# Patient Record
Sex: Male | Born: 1955 | Race: Black or African American | Hispanic: No | Marital: Married | Smoking: Never smoker
Health system: Southern US, Community
[De-identification: ages and names within clinical notes are randomized; demographics above are authoritative.]

## PROBLEM LIST (undated history)

## (undated) DIAGNOSIS — M1990 Unspecified osteoarthritis, unspecified site: Secondary | ICD-10-CM

## (undated) DIAGNOSIS — D509 Iron deficiency anemia, unspecified: Secondary | ICD-10-CM

## (undated) DIAGNOSIS — M4800 Spinal stenosis, site unspecified: Secondary | ICD-10-CM

## (undated) DIAGNOSIS — R1310 Dysphagia, unspecified: Secondary | ICD-10-CM

## (undated) DIAGNOSIS — G709 Myoneural disorder, unspecified: Secondary | ICD-10-CM

## (undated) DIAGNOSIS — K219 Gastro-esophageal reflux disease without esophagitis: Secondary | ICD-10-CM

## (undated) DIAGNOSIS — I452 Bifascicular block: Secondary | ICD-10-CM

## (undated) DIAGNOSIS — I10 Essential (primary) hypertension: Secondary | ICD-10-CM

## (undated) DIAGNOSIS — M5090 Cervical disc disorder, unspecified, unspecified cervical region: Secondary | ICD-10-CM

## (undated) HISTORY — DX: Iron deficiency anemia, unspecified: D50.9

## (undated) HISTORY — DX: Gastro-esophageal reflux disease without esophagitis: K21.9

## (undated) HISTORY — PX: REPLACEMENT TOTAL KNEE: SUR1224

## (undated) HISTORY — PX: LUMBAR SPINE SURGERY: SHX701

## (undated) HISTORY — DX: Spinal stenosis, site unspecified: M48.00

## (undated) HISTORY — PX: COLONOSCOPY: SHX174

## (undated) HISTORY — DX: Essential (primary) hypertension: I10

## (undated) HISTORY — PX: JOINT REPLACEMENT: SHX530

## (undated) HISTORY — PX: OTHER SURGICAL HISTORY: SHX169

## (undated) HISTORY — PX: CERVICAL SPINE SURGERY: SHX589

## (undated) HISTORY — DX: Dysphagia, unspecified: R13.10

## (undated) HISTORY — DX: Cervical disc disorder, unspecified, unspecified cervical region: M50.90

---
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
## 1982-08-19 HISTORY — PX: VASECTOMY: SHX75

## 2002-08-19 HISTORY — PX: EYE SURGERY: SHX253

## 2019-12-08 DIAGNOSIS — M5412 Radiculopathy, cervical region: Secondary | ICD-10-CM | POA: Insufficient documentation

## 2019-12-08 DIAGNOSIS — K219 Gastro-esophageal reflux disease without esophagitis: Secondary | ICD-10-CM | POA: Insufficient documentation

## 2019-12-08 DIAGNOSIS — I10 Essential (primary) hypertension: Secondary | ICD-10-CM | POA: Insufficient documentation

## 2019-12-27 ENCOUNTER — Encounter: Payer: Self-pay | Admitting: Gastroenterology

## 2020-01-14 ENCOUNTER — Encounter: Payer: Self-pay | Admitting: *Deleted

## 2020-01-14 ENCOUNTER — Encounter: Payer: Self-pay | Admitting: Gastroenterology

## 2020-01-14 ENCOUNTER — Ambulatory Visit (INDEPENDENT_AMBULATORY_CARE_PROVIDER_SITE_OTHER): Payer: No Typology Code available for payment source | Admitting: Gastroenterology

## 2020-01-14 VITALS — BP 126/82 | HR 64 | Ht 69.0 in | Wt 204.0 lb

## 2020-01-14 DIAGNOSIS — D509 Iron deficiency anemia, unspecified: Secondary | ICD-10-CM | POA: Insufficient documentation

## 2020-01-14 DIAGNOSIS — Z1211 Encounter for screening for malignant neoplasm of colon: Secondary | ICD-10-CM | POA: Diagnosis not present

## 2020-01-14 DIAGNOSIS — R1310 Dysphagia, unspecified: Secondary | ICD-10-CM | POA: Insufficient documentation

## 2020-01-14 DIAGNOSIS — Z01810 Encounter for preprocedural cardiovascular examination: Secondary | ICD-10-CM | POA: Insufficient documentation

## 2020-01-14 MED ORDER — PEG-KCL-NACL-NASULF-NA ASC-C 100 G PO SOLR: 1.0000 | Freq: Once | ORAL | 0 refills | Status: AC

## 2020-01-14 NOTE — Progress Notes (Signed)
`   01/14/2020 Jonathon Zimmerman [id number] 1956/03/06   HISTORY OF PRESENT ILLNESS: This is a 64 year old male who is new to our office.  [facility].  He tells me his last colonoscopy was at least 10 years ago in [place].  It was normal from what he can recall.  One of the diagnoses for referral was iron deficiency anemia.  Recent hemoglobin was 13.8 g with a normal MCV at 86.  Ferritin was slightly low at 23.3.  Other iron studies were not checked.  Vitamin B12 is normal at 554 and folate was normal at 8.1.  Patient denies any dark or bloody stools.  He admits to some what sounds like mild constipation.  Sounds like he uses a fiber supplement but only on an as-needed basis at this point.  Says that he usually has a bowel movement every couple of days or so.  They also referred him for dysphagia.  He says at this point has pretty much resolved.  He had an upper GI study that showed a small hiatal hernia and laryngeal penetration with minimal aspiration.  He has never had an endoscopy in the past.   Past Medical History:  Diagnosis Date  . Cervical disc disorder   . Dysphagia   . GERD (gastroesophageal reflux disease)   . Hypertension   . IDA (iron deficiency anemia)   . Spinal stenosis    Past Surgical History:  Procedure Laterality Date  . neck surgery    . REPLACEMENT TOTAL KNEE Right     reports that he has never smoked. He has quit using smokeless tobacco.  His smokeless tobacco use included chew. He reports that he does not drink alcohol or use drugs. family history includes Hypertension in his mother. No Known Allergies    Outpatient Encounter Medications as of 01/14/2020  Medication Sig  . gabapentin (NEURONTIN) 800 MG tablet Take 800 mg by mouth in the morning, at noon, and at bedtime.  . hydrochlorothiazide (HYDRODIURIL) 25 MG tablet Take 25 mg by mouth daily.  Marland Kitchen lisinopril (ZESTRIL) 20 MG tablet Take 20 mg  by mouth daily.  . pantoprazole (PROTONIX) 40 MG tablet Take 40 mg by mouth daily.  . polyethylene glycol (MIRALAX / GLYCOLAX) 17 g packet Take 17 g by mouth daily.  . sildenafil (VIAGRA) 100 MG tablet Take 100 mg by mouth daily as needed for erectile dysfunction.   No facility-administered encounter medications on file as of 01/14/2020.     REVIEW OF SYSTEMS  : All other systems reviewed and negative except where noted in the History of Present Illness.   PHYSICAL EXAM: BP 126/82   Pulse 64   Ht 5\' 9"  (1.753 m)   Wt 204 lb (92.5 kg)   SpO2 99%   BMI 30.13 kg/m  General: Well developed AA male in no acute distress Head: Normocephalic and atraumatic Eyes:  Sclerae anicteric, conjunctiva pink. Ears: Normal auditory acuity Lungs: Clear throughout to auscultation; no W/R/R. Heart: Regular rate and rhythm; no M/R/G. Abdomen: Soft, non-distended.  BS present.  Non-tender. Rectal:  Will be done at the time of colonoscopy. Musculoskeletal: Symmetrical with no gross deformities  Skin: No lesions on visible extremities Extremities: No edema  Neurological: Alert oriented x 4, grossly non-focal Psychological:  Alert and cooperative. Normal mood and affect  ASSESSMENT AND PLAN: *Mild iron deficiency:  Ferritin just mildly low.  Hgb normal.  Last colonoscopy 10 years ago.  No dark or bloody stools. *Dysphagia: Reports that this is pretty much resolved.  Barium esophagram showed small hiatal hernia and laryngeal penetration with minimal aspiration.  Has never had an EGD in the past. *Mild constipation:  Uses fiber supplement on ly prn at this point.  Discussed trying it daily. *CRC screening  **We will plan for both EGD and colonoscopy with Dr. Fuller Plan.  Using movi prep at the request of the [place] so they can supply him with a sample/prescription.  2-day bowel prep as well at their recommendation for some mild constipation issues.  The risks, benefits, and alternatives to EGD and colonoscopy were  discussed with the patient and he consents to proceed.   CC:  No ref. provider found

## 2020-01-14 NOTE — Patient Instructions (Signed)
If you are age 63 or older, your body mass index should be between 23-30. Your Body mass index is 30.13 kg/m. If this is out of the aforementioned range listed, please consider follow up with your Primary Care Provider.  If you are age 34 or younger, your body mass index should be between 19-25. Your Body mass index is 30.13 kg/m. If this is out of the aformentioned range listed, please consider follow up with your Primary Care Provider.   You have been scheduled for an endoscopy and colonoscopy. Please follow the written instructions given to you at your visit today. Please pick up your prep supplies at the pharmacy within the next 1-3 days. If you use inhalers (even only as needed), please bring them with you on the day of your procedure.

## 2020-01-14 NOTE — Progress Notes (Signed)
Reviewed and agree with management plan.  Deitrich Steve T. Alek Poncedeleon, MD FACG [facility]  

## 2020-02-24 ENCOUNTER — Other Ambulatory Visit (HOSPITAL_COMMUNITY): Payer: Self-pay

## 2020-02-24 DIAGNOSIS — R1310 Dysphagia, unspecified: Secondary | ICD-10-CM

## 2020-03-03 ENCOUNTER — Encounter (HOSPITAL_COMMUNITY): Payer: Non-veteran care

## 2020-03-03 ENCOUNTER — Ambulatory Visit (HOSPITAL_COMMUNITY): Payer: Non-veteran care

## 2020-03-08 ENCOUNTER — Ambulatory Visit (AMBULATORY_SURGERY_CENTER): Payer: No Typology Code available for payment source | Admitting: Gastroenterology

## 2020-03-08 ENCOUNTER — Encounter: Payer: Self-pay | Admitting: Gastroenterology

## 2020-03-08 ENCOUNTER — Other Ambulatory Visit: Payer: Self-pay

## 2020-03-08 VITALS — BP 128/85 | HR 68 | Temp 96.6°F | Resp 14 | Ht 69.0 in | Wt 204.0 lb

## 2020-03-08 DIAGNOSIS — K449 Diaphragmatic hernia without obstruction or gangrene: Secondary | ICD-10-CM | POA: Diagnosis not present

## 2020-03-08 DIAGNOSIS — K2100 Gastro-esophageal reflux disease with esophagitis, without bleeding: Secondary | ICD-10-CM

## 2020-03-08 DIAGNOSIS — R1310 Dysphagia, unspecified: Secondary | ICD-10-CM | POA: Diagnosis not present

## 2020-03-08 DIAGNOSIS — K222 Esophageal obstruction: Secondary | ICD-10-CM

## 2020-03-08 DIAGNOSIS — Z1211 Encounter for screening for malignant neoplasm of colon: Secondary | ICD-10-CM | POA: Diagnosis not present

## 2020-03-08 MED ORDER — SODIUM CHLORIDE 0.9 % IV SOLN: 500.0000 mL | Freq: Once | INTRAVENOUS | Status: DC

## 2020-03-08 NOTE — Op Note (Signed)
[facility] Patient Name: Jonathon Zimmerman Procedure Date: 03/08/2020 1:27 PM MRN: [id number] Endoscopist: Meryl Dare , MD Age: 64 Referring MD:  Date of Birth: May 15, 1956 Gender: Male Account #: [id number] Procedure:                Colonoscopy Indications:              Screening for colorectal malignant neoplasm Medicines:                Monitored Anesthesia Care Procedure:                Pre-Anesthesia Assessment:                           - Prior to the procedure, a History and Physical                            was performed, and patient medications and                            allergies were reviewed. The patient's tolerance of                            previous anesthesia was also reviewed. The risks                            and benefits of the procedure and the sedation                            options and risks were discussed with the patient.                            All questions were answered, and informed consent                            was obtained. Prior Anticoagulants: The patient has                            taken no previous anticoagulant or antiplatelet                            agents. ASA Grade Assessment: II - A patient with                            mild systemic disease. After reviewing the risks                            and benefits, the patient was deemed in                            satisfactory condition to undergo the procedure.                           After obtaining informed consent, the colonoscope  was passed under direct vision. Throughout the                            procedure, the patient's blood pressure, pulse, and                            oxygen saturations were monitored continuously. The                            Colonoscope was introduced through the anus and                            advanced to the the cecum, identified by                            appendiceal orifice and  ileocecal valve. The                            ileocecal valve, appendiceal orifice, and rectum                            were photographed. The quality of the bowel                            preparation was excellent. The colonoscopy was                            performed without difficulty. The patient tolerated                            the procedure well. Scope In: 1:36:14 PM Scope Out: 1:51:07 PM Scope Withdrawal Time: 0 hours 11 minutes 25 seconds  Total Procedure Duration: 0 hours 14 minutes 53 seconds  Findings:                 The perianal and digital rectal examinations were                            normal.                           A few small-mouthed diverticula were found in the                            sigmoid colon.                           Internal hemorrhoids were found during                            retroflexion. The hemorrhoids were Grade I                            (internal hemorrhoids that do not prolapse).  The exam was otherwise without abnormality on                            direct and retroflexion views. Complications:            No immediate complications. Estimated blood loss:                            None. Estimated Blood Loss:     Estimated blood loss: none. Impression:               - Mild sigmoid colon diverticulosis.                           - Internal hemorrhoids.                           - The examination was otherwise normal on direct                            and retroflexion views.                           - No specimens collected. Recommendation:           - Repeat colonoscopy in 10 years for screening                            purposes.                           - Patient has a contact number available for                            emergencies. The signs and symptoms of potential                            delayed complications were discussed with the                            patient. Return to  normal activities tomorrow.                            Written discharge instructions were provided to the                            patient.                           - Resume previous diet.                           - Continue present medications. Meryl Dare, MD 03/08/2020 2:05:25 PM This report has been signed electronically.

## 2020-03-08 NOTE — Patient Instructions (Signed)
Handouts given for Gastritis, Hiatal hernia, Esophagitis and Stricture and Post-dilation diet. Also handouts given for Diverticulosis and Hemorrhoids.  Follow the post-dilation diet today and continue to take your Pantoprazole 40 mg daily.  YOU HAD AN ENDOSCOPIC PROCEDURE TODAY AT THE [facility]:   Refer to the procedure report that was given to you for any specific questions about what was found during the examination.  If the procedure report does not answer your questions, please call your gastroenterologist to clarify.  If you requested that your care partner not be given the details of your procedure findings, then the procedure report has been included in a sealed envelope for you to review at your convenience later.  YOU SHOULD EXPECT: Some feelings of bloating in the abdomen. Passage of more gas than usual.  Walking can help get rid of the air that was put into your GI tract during the procedure and reduce the bloating. If you had a lower endoscopy (such as a colonoscopy or flexible sigmoidoscopy) you may notice spotting of blood in your stool or on the toilet paper. If you underwent a bowel prep for your procedure, you may not have a normal bowel movement for a few days.  Please Note:  You might notice some irritation and congestion in your nose or some drainage.  This is from the oxygen used during your procedure.  There is no need for concern and it should clear up in a day or so.  SYMPTOMS TO REPORT IMMEDIATELY:   Following lower endoscopy (colonoscopy or flexible sigmoidoscopy):  Excessive amounts of blood in the stool  Significant tenderness or worsening of abdominal pains  Swelling of the abdomen that is new, acute  Fever of 100F or higher   Following upper endoscopy (EGD)  Vomiting of blood or coffee ground material  New chest pain or pain under the shoulder blades  Painful or persistently difficult swallowing  New shortness of breath  Fever of 100F or  higher  Black, tarry-looking stools  For urgent or emergent issues, a gastroenterologist can be reached at any hour by calling [phone]. Do not use MyChart messaging for urgent concerns.    DIET:  We do recommend a small meal at first, but then you may proceed to your regular diet.  Drink plenty of fluids but you should avoid alcoholic beverages for 24 hours.  ACTIVITY:  You should plan to take it easy for the rest of today and you should NOT DRIVE or use heavy machinery until tomorrow (because of the sedation medicines used during the test).    FOLLOW UP: Our staff will call the number listed on your records 48-72 hours following your procedure to check on you and address any questions or concerns that you may have regarding the information given to you following your procedure. If we do not reach you, we will leave a message.  We will attempt to reach you two times.  During this call, we will ask if you have developed any symptoms of COVID 19. If you develop any symptoms (ie: fever, flu-like symptoms, shortness of breath, cough etc.) before then, please call [phone].  If you test positive for Covid 19 in the 2 weeks post procedure, please call and report this information to [place].    If any biopsies were taken you will be contacted by phone or by letter within the next 1-3 weeks.  Please call us at [phone] if you have not heard about the biopsies in 3  weeks.    SIGNATURES/CONFIDENTIALITY: You and/or your care partner have signed paperwork which will be entered into your electronic medical record.  These signatures attest to the fact that that the information above on your After Visit Summary has been reviewed and is understood.  Full responsibility of the confidentiality of this discharge information lies with you and/or your care-partner.

## 2020-03-08 NOTE — Progress Notes (Signed)
VS by CW  Pt's states no medical or surgical changes since previsit or office visit.  

## 2020-03-08 NOTE — Op Note (Signed)
[facility] Patient Name: Jonathon Zimmerman Procedure Date: 03/08/2020 1:27 PM MRN: [id number] Endoscopist: Meryl Dare , MD Age: 64 Referring MD:  Date of Birth: 1956/01/11 Gender: Male Account #: [id number] Procedure:                Upper GI endoscopy Indications:              Dysphagia Medicines:                Monitored Anesthesia Care Procedure:                Pre-Anesthesia Assessment:                           - Prior to the procedure, a History and Physical                            was performed, and patient medications and                            allergies were reviewed. The patient's tolerance of                            previous anesthesia was also reviewed. The risks                            and benefits of the procedure and the sedation                            options and risks were discussed with the patient.                            All questions were answered, and informed consent                            was obtained. Prior Anticoagulants: The patient has                            taken no previous anticoagulant or antiplatelet                            agents. ASA Grade Assessment: II - A patient with                            mild systemic disease. After reviewing the risks                            and benefits, the patient was deemed in                            satisfactory condition to undergo the procedure.                           After obtaining informed consent, the endoscope was  passed under direct vision. Throughout the                            procedure, the patient's blood pressure, pulse, and                            oxygen saturations were monitored continuously. The                            Endoscope was introduced through the mouth, and                            advanced to the second part of duodenum. The upper                            GI endoscopy was accomplished without difficulty.                             The patient tolerated the procedure well. Scope In: Scope Out: Findings:                 LA Grade B (one or more mucosal breaks greater than                            5 mm, not extending between the tops of two mucosal                            folds) esophagitis with no bleeding was found at                            the gastroesophageal junction.                           One benign-appearing, intrinsic moderate stenosis                            was found at the gastroesophageal junction. This                            stenosis measured 1.3 cm (inner diameter) x less                            than one cm (in length). The stenosis was                            traversed. A guidewire was placed and the scope was                            withdrawn. Dilations were performed with Savary                            dilators with mild resistance at 14 mm, 15 mm and  16 mm. No heme noted.                           The exam of the esophagus was otherwise normal.                           A few localized small erosions with no bleeding and                            no stigmata of recent bleeding were found in the                            gastric antrum. Biopsies were taken with a cold                            forceps for histology.                           A small hiatal hernia was present.                           The exam of the stomach was otherwise normal.                           The duodenal bulb and second portion of the                            duodenum were normal. Complications:            No immediate complications. Estimated Blood Loss:     Estimated blood loss was minimal. Impression:               - LA Grade B reflux esophagitis with no bleeding.                           - Benign-appearing esophageal stenosis. Dilated.                           - Erosive gastropathy with no bleeding and no                             stigmata of recent bleeding. Biopsied.                           - Small hiatal hernia.                           - Normal duodenal bulb and second portion of the                            duodenum. Recommendation:           - Patient has a contact number available for                            emergencies. The signs and symptoms of potential  delayed complications were discussed with the                            patient. Return to normal activities tomorrow.                            Written discharge instructions were provided to the                            patient.                           - Clear liquid diet for 2 hours, then advance as                            tolerated to soft diet today.                           - Resume prior diet tomorrow.                           - Antireflux measures long term.                           - Continue present medications including                            pantoprazole 40 mg po qd (not prn) long term.                           - Await pathology results. Meryl Dare, MD 03/08/2020 2:11:11 PM This report has been signed electronically.

## 2020-03-08 NOTE — Progress Notes (Signed)
To PACU, VSS. Report to [email or web address] 

## 2020-03-10 ENCOUNTER — Telehealth: Payer: Self-pay | Admitting: *Deleted

## 2020-03-10 NOTE — Telephone Encounter (Signed)
  Follow up Call-  Call back number 03/08/2020  Post procedure Call Back phone  # [phone]  Permission to leave phone message Yes     Patient questions:  Do you have a fever, pain , or abdominal swelling? No. Pain Score  0 *  Have you tolerated food without any problems? Yes.    Have you been able to return to your normal activities? Yes.    Do you have any questions about your discharge instructions: Diet   No. Medications  No. Follow up visit  No.  Do you have questions or concerns about your Care? No.  Actions: * If pain score is 4 or above: No action needed, pain <4.  1. Have you developed a fever since your procedure? no  2.   Have you had an respiratory symptoms (SOB or cough) since your procedure? no  3.   Have you tested positive for COVID 19 since your procedure no  4.   Have you had any family members/close contacts diagnosed with the COVID 19 since your procedure?  no   If yes to any of these questions please route to Laverna Peace, RN and Charlett Lango, RN

## 2020-03-14 ENCOUNTER — Encounter: Payer: Self-pay | Admitting: Gastroenterology

## 2020-05-31 ENCOUNTER — Other Ambulatory Visit: Payer: Self-pay | Admitting: Neurosurgery

## 2020-06-14 NOTE — Pre-Procedure Instructions (Signed)
[facility] [place], [place] - [address] [phone] [facility] [address] [address] Phone: [phone] Fax: [phone]     Your procedure is scheduled on Monday, November 1st, from 10:10 AM- 2:01 PM.  Report to [facility] Main Entrance "A" at 08:10 A.M., and check in at the Admitting office.  Call this number if you have problems the morning of surgery:  [phone]  Call [phone] if you have any questions prior to your surgery date Monday-Friday 8am-4pm    Remember:  Do not eat or drink after midnight the night before your surgery.     Take these medicines the morning of surgery with A SIP OF WATER: gabapentin (NEURONTIN)  pantoprazole (PROTONIX)    As of today, STOP taking any Aspirin (unless otherwise instructed by your surgeon) Aleve, Naproxen, Ibuprofen, Motrin, Advil, Goody's, BC's, all herbal medications, fish oil, and all vitamins.   The Morning of Surgery:                    Do not wear jewelry.            Do not wear lotions, powders, colognes, or deodorant.            Men may shave face and neck.            Do not bring valuables to the hospital.            [facility] is not responsible for any belongings or valuables.  Do NOT Smoke (Tobacco/Vaping) or drink Alcohol 24 hours prior to your procedure.  If you use a CPAP at night, you may bring all equipment for your overnight stay.   Contacts, glasses, dentures or bridgework may not be worn into surgery.      For patients admitted to the hospital, discharge time will be determined by your treatment team.   Patients discharged the day of surgery will not be allowed to drive home, and someone needs to stay with them for 24 hours.    Special instructions:   [place]- Preparing For Surgery  Before surgery, you can play an important role. Because skin is not sterile, your skin needs to be as free of germs as possible. You can reduce the number of  germs on your skin by washing with CHG (chlorahexidine gluconate) Soap before surgery.  CHG is an antiseptic cleaner which kills germs and bonds with the skin to continue killing germs even after washing.    Oral Hygiene is also important to reduce your risk of infection.  Remember - BRUSH YOUR TEETH THE MORNING OF SURGERY WITH YOUR REGULAR TOOTHPASTE  Please do not use if you have an allergy to CHG or antibacterial soaps. If your skin becomes reddened/irritated stop using the CHG.  Do not shave (including legs and underarms) for at least 48 hours prior to first CHG shower. It is OK to shave your face.  Please follow these instructions carefully.   1. Shower the NIGHT BEFORE SURGERY and the MORNING OF SURGERY with CHG Soap.   2. If you chose to wash your hair, wash your hair first as usual with your normal shampoo.  3. After you shampoo, rinse your hair and body thoroughly to remove the shampoo.  4. Use CHG as you would any other liquid soap. You can apply CHG directly to the skin and wash gently with a scrungie or a clean washcloth.   5. Apply the CHG Soap to your body  ONLY FROM THE NECK DOWN.  Do not use on open wounds or open sores. Avoid contact with your eyes, ears, mouth and genitals (private parts). Wash Face and genitals (private parts)  with your normal soap.   6. Wash thoroughly, paying special attention to the area where your surgery will be performed.  7. Thoroughly rinse your body with warm water from the neck down.  8. DO NOT shower/wash with your normal soap after using and rinsing off the CHG Soap.  9. Pat yourself dry with a CLEAN TOWEL.  10. Wear CLEAN PAJAMAS to bed the night before surgery  11. Place CLEAN SHEETS on your bed the night of your first shower and DO NOT SLEEP WITH PETS.   Day of Surgery: SHOWER Wear Clean/Comfortable clothing the morning of surgery Do not apply any deodorants/lotions.   Remember to brush your teeth WITH YOUR REGULAR TOOTHPASTE.    Please read over the following fact sheets that you were given.

## 2020-06-15 ENCOUNTER — Other Ambulatory Visit (HOSPITAL_COMMUNITY)
Admission: RE | Admit: 2020-06-15 | Discharge: 2020-06-15 | Disposition: A | Discharge status: Home / Self Care | Payer: No Typology Code available for payment source | Source: Ambulatory Visit | Attending: Neurosurgery | Admitting: Neurosurgery

## 2020-06-15 ENCOUNTER — Encounter (HOSPITAL_COMMUNITY)
Admission: RE | Admit: 2020-06-15 | Discharge: 2020-06-15 | Disposition: A | Discharge status: Home / Self Care | Payer: No Typology Code available for payment source | Source: Ambulatory Visit | Attending: Neurosurgery | Admitting: Neurosurgery

## 2020-06-15 ENCOUNTER — Encounter (HOSPITAL_COMMUNITY): Payer: Self-pay

## 2020-06-15 ENCOUNTER — Other Ambulatory Visit: Payer: Self-pay

## 2020-06-15 DIAGNOSIS — Z96651 Presence of right artificial knee joint: Secondary | ICD-10-CM | POA: Insufficient documentation

## 2020-06-15 DIAGNOSIS — Z981 Arthrodesis status: Secondary | ICD-10-CM | POA: Diagnosis not present

## 2020-06-15 DIAGNOSIS — Z01818 Encounter for other preprocedural examination: Secondary | ICD-10-CM | POA: Diagnosis present

## 2020-06-15 DIAGNOSIS — D509 Iron deficiency anemia, unspecified: Secondary | ICD-10-CM | POA: Insufficient documentation

## 2020-06-15 DIAGNOSIS — Z79899 Other long term (current) drug therapy: Secondary | ICD-10-CM | POA: Diagnosis not present

## 2020-06-15 DIAGNOSIS — Z20822 Contact with and (suspected) exposure to covid-19: Secondary | ICD-10-CM | POA: Insufficient documentation

## 2020-06-15 DIAGNOSIS — I10 Essential (primary) hypertension: Secondary | ICD-10-CM | POA: Diagnosis not present

## 2020-06-15 DIAGNOSIS — I452 Bifascicular block: Secondary | ICD-10-CM | POA: Insufficient documentation

## 2020-06-15 DIAGNOSIS — Z01812 Encounter for preprocedural laboratory examination: Secondary | ICD-10-CM | POA: Insufficient documentation

## 2020-06-15 DIAGNOSIS — K219 Gastro-esophageal reflux disease without esophagitis: Secondary | ICD-10-CM | POA: Insufficient documentation

## 2020-06-15 DIAGNOSIS — M4310 Spondylolisthesis, site unspecified: Secondary | ICD-10-CM | POA: Insufficient documentation

## 2020-06-15 LAB — SARS CORONAVIRUS 2 (TAT 6-24 HRS): SARS Coronavirus 2: NEGATIVE

## 2020-06-15 LAB — CBC WITH DIFFERENTIAL/PLATELET
Abs Immature Granulocytes: 0 10*3/uL (ref 0.00–0.07)
Basophils Absolute: 0 10*3/uL (ref 0.0–0.1)
Basophils Relative: 1 %
Eosinophils Absolute: 0.1 10*3/uL (ref 0.0–0.5)
Eosinophils Relative: 6 %
HCT: 39.9 % (ref 39.0–52.0)
Hemoglobin: 13.4 g/dL (ref 13.0–17.0)
Lymphocytes Relative: 41 %
Lymphs Abs: 1 10*3/uL (ref 0.7–4.0)
MCH: 29.8 pg (ref 26.0–34.0)
MCHC: 33.6 g/dL (ref 30.0–36.0)
MCV: 88.9 fL (ref 80.0–100.0)
Monocytes Absolute: 0.2 10*3/uL (ref 0.1–1.0)
Monocytes Relative: 10 %
Neutro Abs: 1 10*3/uL — ABNORMAL LOW (ref 1.7–7.7)
Neutrophils Relative %: 42 %
Platelets: 212 10*3/uL (ref 150–400)
RBC: 4.49 MIL/uL (ref 4.22–5.81)
RDW: 11.6 % (ref 11.5–15.5)
WBC: 2.4 10*3/uL — ABNORMAL LOW (ref 4.0–10.5)
nRBC: 0 % (ref 0.0–0.2)
nRBC: 0 /100 WBC

## 2020-06-15 LAB — SURGICAL PCR SCREEN
MRSA, PCR: NEGATIVE
Staphylococcus aureus: NEGATIVE

## 2020-06-15 LAB — BASIC METABOLIC PANEL
Anion gap: 10 (ref 5–15)
BUN: 9 mg/dL (ref 8–23)
CO2: 28 mmol/L (ref 22–32)
Calcium: 9.5 mg/dL (ref 8.9–10.3)
Chloride: 99 mmol/L (ref 98–111)
Creatinine, Ser: 1.13 mg/dL (ref 0.61–1.24)
GFR, Estimated: >60 mL/min (ref >60)
Glucose, Bld: 96 mg/dL (ref 70–99)
Potassium: 4 mmol/L (ref 3.5–5.1)
Sodium: 137 mmol/L (ref 135–145)

## 2020-06-15 LAB — TYPE AND SCREEN
ABO/RH(D): A POS
Antibody Screen: NEGATIVE

## 2020-06-15 NOTE — Progress Notes (Addendum)
Anesthesia APP PAT Evaluation:  Case: [id number] Date/Time: 06/19/20 0955   Procedure: PLIF - L4-L5 - L5-S1 (N/A Back)   Anesthesia type: General   Pre-op diagnosis: Spondylolisthesis   Location: [facility] ROOM 21 / [facility] OR   Surgeons: Jonathon Sicks, MD      DISCUSSION: Patient is a 64 year old male scheduled for the above procedures.  History includes never smoker, HTN, GERD, iron deficiency anemia, neck surgery (C3-4 ACDF; C2-5 posterior fusion 2018) , right TKA (2019).  S/p EGD/colonsocpy 03/08/20.   Preoperative EKG showed SB at 58 bpm, occasional PACs, RBBB, LAFB, bifascicular block, septal infarct age undetermined, minimal voltage criteria for LVH. Currently no comparison EKG tracing, although he thinks he might have had one in 2018-2019 prior to surgery at the [facility] in [place], [place]. He moved to [facility] in September 2020. He denied chest pain, SOB, edema, palpitations, syncope, presyncope, orthopnea. He is not a smoker or known diabetic. He denied known personal or family history of CAD. Prior to moving to [place] he was road biking up to 16 miles several times a week. Although now with some limitations from his back, he is still able to road bike (including inclines) 6-8 miles 5-6 times per week. He does not get chest pain, SOB, syncope/presyncope with this level of exercise. He also does light weight training. He admits it has been a few weeks since he went road biking, but says this is not due to changes in his energy, chest pain or for SOB.   He has some mild neck movement limitations following anterior and posterior cervical fusion, but denied history of difficult intubation or issues with limited mouth opening.   Preoperative labs include WBC 2.4, absolute Neutro 1.0K, relative 42%, otherwise WNL. He reported labs done at the [facility] earlier this week but doesn't know the results yet, so currently last comparison CBC (without diff) is from 12/08/19 with [facility] and showed WBC 3.3. He denied fever,  chills, sick symptoms. He is on HCTZ which could be contributing. Advised on-going primary care follow-up. Results also called to Jonathon Zimmerman at Dr. Lindalou Hose office.   Patient with good exercise tolerance, but abnormal EKG including bifascicular block (currently no comparison available). He has had some mild positional dizziness in the past, but indicated no issues with syncope/presyncope even with aerobic activity. Discussed with patient theoretically could progress to heart block/need of PPM at some point, but currently was not reporting any significant CV symptoms. However, after discussion with several anesthesiologists including Jonathon Loron, MD, recommendation for preoperative cardiology evaluation. I notified Jonathon Zimmerman and Jonathon Zimmerman. He now has appointment at [facility] on 06/16/20 at 9:00 AM.     ADDENDUM 06/16/20 9:18 AM: Patient evaluated by cardiologist Jonathon Mainland, MD this morning. He wrote, "Cardiac risk sratification: Excellent baseline functional capacity without any symptoms of angina or angina equivalent.  Normal physical exam.  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  His perioperative cardiac risk is low.  No further testing is necessary.  Patient can proceed with spinal surgery as scheduled on 06/19/2020.  I will see him on as-needed basis."  COVID-19 test negative. Anesthesia team to evaluate on the day of surgery.   VS: BP 123/89   Pulse 72   Temp 36.4 C (Oral)   Resp 18   Ht 5\' 9"  (1.753 m)   Wt 89.2 kg   SpO2 100%   BMI 29.03 kg/m    PROVIDERS:  PCP is with [facility]. Jonathon Curtis, MD is his [facility] PCP with [facility]. He had annual exam on 04/11/20 with Dr. Kathryne Gin. He reported some intermittent dizziness, patient says mainly positional and not severe--denied presyncope/syncope, palpitations, chest pain even with exercise.    LABS: Preoperative  labs noted. See DISCUSSION. (all labs ordered are listed, but only abnormal results are displayed)  Labs Reviewed  CBC WITH DIFFERENTIAL/PLATELET - Abnormal; Notable for the following components:      Result Value   WBC 2.4 (*)    Neutro Abs 1.0 (*)    All other components within normal limits  SURGICAL PCR SCREEN  BASIC METABOLIC PANEL  PATHOLOGIST SMEAR REVIEW  TYPE AND SCREEN    OTHER: EGD 03/08/20: Impression: - LA Grade B reflux esophagitis with no bleeding. - Benign-appearing esophageal stenosis. Dilated. - Erosive gastropathy with no bleeding and no stigmata of recent bleeding. Biopsied. [pathology: benign gastric mucosa] - Small hiatal hernia. - Normal duodenal bulb and second portion of the duodenum.  Colonoscopy 03/08/20 Impression: - Mild sigmoid colon diverticulosis. - Internal hemorrhoids. - The examination was otherwise normal on direct and retroflexion views. - No specimens collected.   IMAGES: MRI C-spine 03/22/20 ([facility]): IMPRESSION:  1. Patient again status post ACDF C3-4, posterior decompression C3-4, C4-5 and posterior fixation C2-C5. Fixating hardware in place without obvious complications. No significant spinal canal stenosis at the levels of fusion. Prominent foraminal stenosis suggested on the right at C4-5.  2. Prominent adjacent level disease at C5-6 resulting in moderate spinal canal stenosis and mild cord impingement/compression. There is also severe bilateral foraminal stenosis.  3. Small posterior disc osteophyte complex at C6-7 appears to minimally impinge on the ventral spinal cord. Mild bilateral foraminal stenosis.  4. Prominent foraminal stenosis on the left at C7-T1.  5. No acute fracture.  6. 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   Past Medical History:  Diagnosis Date  . Cervical disc disorder   . Dysphagia   . GERD (gastroesophageal reflux disease)   . Hypertension   . IDA (iron deficiency anemia)   . Spinal stenosis     Past Surgical History:  Procedure Laterality Date  . EYE SURGERY Bilateral 2004   Lasix  . JOINT REPLACEMENT    . neck surgery    . REPLACEMENT TOTAL KNEE Right   . VASECTOMY  1984    MEDICATIONS: . gabapentin (NEURONTIN) 800 MG tablet  . hydrochlorothiazide (HYDRODIURIL) 25 MG tablet  . ibuprofen (ADVIL) 800 MG tablet  . lisinopril (ZESTRIL) 20 MG tablet  . pantoprazole (PROTONIX) 40 MG tablet  . polyethylene glycol (MIRALAX / GLYCOLAX) 17 g packet  . sildenafil (VIAGRA) 100 MG tablet   No current facility-administered medications for this encounter.    Shonna Chock, PA-C Surgical Short Stay/Anesthesiology [facility] Phone [phone] [facility] Phone [phone] 06/15/2020 3:38 PM

## 2020-06-15 NOTE — Progress Notes (Addendum)
PCP - [facility]ry[facility] Cardiologist - Denies   Chest x-ray - Not indicated EKG - 06/15/20 Stress Test - A long time ago ECHO - Denies Cardiac Cath - Denies  Sleep Study - No  DM - Denies  COVID TEST-  06/15/20  Anesthesia review: Yes, abnormal EKG and WBC 2.4  Patient denies shortness of breath, fever, cough and chest pain at PAT appointment   All instructions explained to the patient, with a verbal understanding of the material. Patient agrees to go over the instructions while at home for a better understanding. Patient also instructed to self quarantine after being tested for COVID-19. The opportunity to ask questions was provided.

## 2020-06-16 ENCOUNTER — Ambulatory Visit: Payer: Non-veteran care | Admitting: Cardiology

## 2020-06-16 ENCOUNTER — Encounter: Payer: Self-pay | Admitting: Cardiology

## 2020-06-16 VITALS — BP 128/95 | HR 74 | Resp 16 | Ht 69.0 in | Wt 194.0 lb

## 2020-06-16 DIAGNOSIS — Z01810 Encounter for preprocedural cardiovascular examination: Secondary | ICD-10-CM

## 2020-06-16 DIAGNOSIS — I10 Essential (primary) hypertension: Secondary | ICD-10-CM

## 2020-06-16 LAB — PATHOLOGIST SMEAR REVIEW

## 2020-06-16 NOTE — Progress Notes (Signed)
Patient referred by Earnie Larsson, MD for pre-op risk stratificaiton  Subjective:   Jonathon Zimmerman, male    DOB: 10-Apr-1956, 64 y.o.   MRN: [id number]   Chief Complaint  Patient presents with  . Surgical Clearance    Spinal Fusion 06/19/20  . New Patient (Initial Visit)    Referred by Dr. Annette Stable  . Hypertension     HPI  64 y.o. African-American male with hypertension, here for cardiac risk stratification prior to L4-L5, L5-S1 fusion surgery.   Patient moved from [place] to [place] about a year ago.  He is very active with his stationary as well as outdoor biking.  He bikes at least 6-8 miles every day, but biked out including hills.  With this level of activity, he does not have any chest pain, or shortness of breath, also denies any orthopnea, PND, leg edema.  His limitation from his spine is walking.  He is scheduled to undergo spinal fusion surgery on Monday 06/19/2020.   Past Medical History:  Diagnosis Date  . Cervical disc disorder   . Dysphagia   . GERD (gastroesophageal reflux disease)   . Hypertension   . IDA (iron deficiency anemia)   . Spinal stenosis      Past Surgical History:  Procedure Laterality Date  . EYE SURGERY Bilateral 2004   Lasix  . JOINT REPLACEMENT    . neck surgery    . REPLACEMENT TOTAL KNEE Right   . VASECTOMY  1984     Social History   Tobacco Use  Smoking Status Never Smoker  Smokeless Tobacco Former Systems developer  . Types: Chew    Social History   Substance and Sexual Activity  Alcohol Use Yes   Comment: occasional     Family History  Problem Relation Age of Onset  . Hypertension Mother      Current Outpatient Medications on File Prior to Visit  Medication Sig Dispense Refill  . gabapentin (NEURONTIN) 800 MG tablet Take 800 mg by mouth in the morning, at noon, and at bedtime.    . hydrochlorothiazide (HYDRODIURIL) 25 MG tablet Take 25 mg by mouth daily.    Marland Kitchen ibuprofen (ADVIL) 800 MG tablet Take 800 mg  by mouth every 8 (eight) hours as needed for moderate pain.    Marland Kitchen lisinopril (ZESTRIL) 20 MG tablet Take 20 mg by mouth daily.    . pantoprazole (PROTONIX) 40 MG tablet Take 40 mg by mouth daily.     . polyethylene glycol (MIRALAX / GLYCOLAX) 17 g packet Take 17 g by mouth daily.     . sildenafil (VIAGRA) 100 MG tablet Take 100 mg by mouth daily as needed for erectile dysfunction.      No current facility-administered medications on file prior to visit.    Cardiovascular and other pertinent studies:  EKG 06/15/2020: Sinus rhythm 58 bpm.  Right bundle branch block, left anterior fascicular block.  Left ventricular hypertrophy.   Recent labs: 06/15/2020: Glucose 96, BUN/Cr 9/1.13. EGFR >60. Na/K 137/4.0.  H/H 13/39. MCV 88. Platelets 212  12/08/2019: Chol 137, TG 97, HDL 52, LDL 67    Review of Systems  Cardiovascular: Negative for chest pain, dyspnea on exertion, leg swelling, palpitations and syncope.  Musculoskeletal: Positive for back pain.         Vitals:   06/16/20 0836  BP: (!) 128/95  Pulse: 74  Resp: 16  SpO2: 98%     Body mass index is 28.65 kg/m. Filed Weights  06/16/20 0836  Weight: 194 lb (88 kg)     Objective:   Physical Exam Vitals and nursing note reviewed.  Constitutional:      General: He is not in acute distress. Neck:     Vascular: No JVD.  Cardiovascular:     Rate and Rhythm: Normal rate and regular rhythm.     Heart sounds: Normal heart sounds. No murmur heard.   Pulmonary:     Effort: Pulmonary effort is normal.     Breath sounds: Normal breath sounds. No wheezing or rales.         Assessment & Recommendations:   64 y.o. African-American male with hypertension, here for cardiac risk stratification prior to L4-L5, L5-S1 fusion surgery.   Cardiac risk sratification: Excellent baseline functional capacity without any symptoms of angina or angina equivalent.  Normal physical exam.  EKG shows right bundle branch block and left  anterior fascicular block, likely related to hypertension.  His perioperative cardiac risk is low.  No further testing is necessary.  Patient can proceed with spinal surgery as scheduled on 06/19/2020.  I will see him on as-needed basis.     Thank you for referring the patient to [place]. Please feel free to contact with any questions.   Nigel Mormon, MD Pager: [phone] Office: [phone]

## 2020-06-19 ENCOUNTER — Inpatient Hospital Stay (HOSPITAL_COMMUNITY)
Admission: RE | Admit: 2020-06-19 | Discharge: 2020-06-20 | DRG: 455 | Disposition: A | Discharge status: Home / Self Care | Payer: No Typology Code available for payment source | Attending: Neurosurgery | Admitting: Neurosurgery

## 2020-06-19 ENCOUNTER — Inpatient Hospital Stay (HOSPITAL_COMMUNITY)
Admission: RE | Disposition: A | Discharge status: Home / Self Care | Payer: Self-pay | Source: Home / Self Care | Attending: Neurosurgery

## 2020-06-19 ENCOUNTER — Inpatient Hospital Stay (HOSPITAL_COMMUNITY): Payer: No Typology Code available for payment source | Admitting: Anesthesiology

## 2020-06-19 ENCOUNTER — Other Ambulatory Visit: Payer: Self-pay

## 2020-06-19 ENCOUNTER — Inpatient Hospital Stay (HOSPITAL_COMMUNITY): Payer: No Typology Code available for payment source

## 2020-06-19 ENCOUNTER — Encounter (HOSPITAL_COMMUNITY): Payer: Self-pay | Admitting: Neurosurgery

## 2020-06-19 ENCOUNTER — Inpatient Hospital Stay (HOSPITAL_COMMUNITY): Payer: No Typology Code available for payment source | Admitting: Vascular Surgery

## 2020-06-19 DIAGNOSIS — I10 Essential (primary) hypertension: Secondary | ICD-10-CM | POA: Diagnosis present

## 2020-06-19 DIAGNOSIS — K219 Gastro-esophageal reflux disease without esophagitis: Secondary | ICD-10-CM | POA: Diagnosis present

## 2020-06-19 DIAGNOSIS — M4316 Spondylolisthesis, lumbar region: Principal | ICD-10-CM | POA: Diagnosis present

## 2020-06-19 DIAGNOSIS — Z87891 Personal history of nicotine dependence: Secondary | ICD-10-CM | POA: Diagnosis not present

## 2020-06-19 DIAGNOSIS — D509 Iron deficiency anemia, unspecified: Secondary | ICD-10-CM | POA: Diagnosis present

## 2020-06-19 DIAGNOSIS — Z96651 Presence of right artificial knee joint: Secondary | ICD-10-CM | POA: Diagnosis present

## 2020-06-19 DIAGNOSIS — Z79899 Other long term (current) drug therapy: Secondary | ICD-10-CM | POA: Diagnosis not present

## 2020-06-19 DIAGNOSIS — I951 Orthostatic hypotension: Secondary | ICD-10-CM | POA: Diagnosis not present

## 2020-06-19 DIAGNOSIS — Z8249 Family history of ischemic heart disease and other diseases of the circulatory system: Secondary | ICD-10-CM

## 2020-06-19 DIAGNOSIS — Z419 Encounter for procedure for purposes other than remedying health state, unspecified: Secondary | ICD-10-CM

## 2020-06-19 DIAGNOSIS — M48061 Spinal stenosis, lumbar region without neurogenic claudication: Secondary | ICD-10-CM | POA: Diagnosis present

## 2020-06-19 DIAGNOSIS — M5416 Radiculopathy, lumbar region: Secondary | ICD-10-CM | POA: Diagnosis present

## 2020-06-19 DIAGNOSIS — M4310 Spondylolisthesis, site unspecified: Secondary | ICD-10-CM | POA: Diagnosis present

## 2020-06-19 LAB — POCT I-STAT EG7
Acid-Base Excess: 1 mmol/L (ref 0.0–2.0)
Bicarbonate: 28.1 mmol/L — ABNORMAL HIGH (ref 20.0–28.0)
Calcium, Ion: 1.27 mmol/L (ref 1.15–1.40)
HCT: 36 % — ABNORMAL LOW (ref 39.0–52.0)
Hemoglobin: 12.2 g/dL — ABNORMAL LOW (ref 13.0–17.0)
O2 Saturation: 31 %
Potassium: 4.1 mmol/L (ref 3.5–5.1)
Sodium: 137 mmol/L (ref 135–145)
TCO2: 30 mmol/L (ref 22–32)
pCO2, Ven: 52.1 mmHg (ref 44.0–60.0)
pH, Ven: 7.339 (ref 7.250–7.430)
pO2, Ven: 21 mmHg — CL (ref 32.0–45.0)

## 2020-06-19 LAB — GLUCOSE, CAPILLARY: Glucose-Capillary: 134 mg/dL — ABNORMAL HIGH (ref 70–99)

## 2020-06-19 LAB — ABO/RH: ABO/RH(D): A POS

## 2020-06-19 SURGERY — POSTERIOR LUMBAR FUSION 2 LEVEL
Anesthesia: General | Site: Back

## 2020-06-19 MED ORDER — THROMBIN 20000 UNITS EX SOLR
CUTANEOUS | Status: AC
  Filled 2020-06-19: qty 20000

## 2020-06-19 MED ORDER — FENTANYL CITRATE (PF) 100 MCG/2ML IJ SOLN
INTRAMUSCULAR | Status: DC | PRN
  Administered 2020-06-19 (×2): 50 ug via INTRAVENOUS
  Administered 2020-06-19: 100 ug via INTRAVENOUS
  Administered 2020-06-19: 50 ug via INTRAVENOUS

## 2020-06-19 MED ORDER — SUGAMMADEX SODIUM 200 MG/2ML IV SOLN
INTRAVENOUS | Status: DC | PRN
  Administered 2020-06-19: 200 mg via INTRAVENOUS

## 2020-06-19 MED ORDER — CHLORHEXIDINE GLUCONATE CLOTH 2 % EX PADS: 6.0000 | MEDICATED_PAD | Freq: Once | CUTANEOUS | Status: DC

## 2020-06-19 MED ORDER — GABAPENTIN 800 MG PO TABS
800.0000 mg | ORAL_TABLET | Freq: Three times a day (TID) | ORAL | Status: DC
  Filled 2020-06-19 (×2): qty 1

## 2020-06-19 MED ORDER — HYDROMORPHONE HCL 1 MG/ML IJ SOLN: 0.2500 mg | INTRAMUSCULAR | Status: DC | PRN

## 2020-06-19 MED ORDER — AMISULPRIDE (ANTIEMETIC) 5 MG/2ML IV SOLN: 10.0000 mg | Freq: Once | INTRAVENOUS | Status: DC | PRN

## 2020-06-19 MED ORDER — HYDROMORPHONE HCL 1 MG/ML IJ SOLN: 1.0000 mg | INTRAMUSCULAR | Status: DC | PRN

## 2020-06-19 MED ORDER — CEFAZOLIN SODIUM-DEXTROSE 1-4 GM/50ML-% IV SOLN
1.0000 g | Freq: Three times a day (TID) | INTRAVENOUS | Status: AC
  Administered 2020-06-19 – 2020-06-20 (×2): 1 g via INTRAVENOUS
  Filled 2020-06-19 (×2): qty 50

## 2020-06-19 MED ORDER — PHENYLEPHRINE 40 MCG/ML (10ML) SYRINGE FOR IV PUSH (FOR BLOOD PRESSURE SUPPORT)
PREFILLED_SYRINGE | INTRAVENOUS | Status: DC | PRN
  Administered 2020-06-19 (×6): 80 ug via INTRAVENOUS

## 2020-06-19 MED ORDER — THROMBIN 5000 UNITS EX SOLR
CUTANEOUS | Status: AC
  Filled 2020-06-19: qty 5000

## 2020-06-19 MED ORDER — OXYCODONE HCL 5 MG/5ML PO SOLN: 5.0000 mg | Freq: Once | ORAL | Status: DC | PRN

## 2020-06-19 MED ORDER — ONDANSETRON HCL 4 MG/2ML IJ SOLN
INTRAMUSCULAR | Status: AC
  Filled 2020-06-19: qty 2

## 2020-06-19 MED ORDER — ALBUMIN HUMAN 5 % IV SOLN
12.5000 g | Freq: Once | INTRAVENOUS | Status: AC
  Administered 2020-06-19: 12.5 g via INTRAVENOUS

## 2020-06-19 MED ORDER — VANCOMYCIN HCL 1000 MG IV SOLR
INTRAVENOUS | Status: AC
  Filled 2020-06-19: qty 1000

## 2020-06-19 MED ORDER — BUPIVACAINE HCL (PF) 0.25 % IJ SOLN
INTRAMUSCULAR | Status: DC | PRN
  Administered 2020-06-19: 30 mL

## 2020-06-19 MED ORDER — PHENYLEPHRINE 40 MCG/ML (10ML) SYRINGE FOR IV PUSH (FOR BLOOD PRESSURE SUPPORT): PREFILLED_SYRINGE | INTRAVENOUS | Status: DC | PRN

## 2020-06-19 MED ORDER — ORAL CARE MOUTH RINSE: 15.0000 mL | Freq: Once | OROMUCOSAL | Status: AC

## 2020-06-19 MED ORDER — MIDAZOLAM HCL 2 MG/2ML IJ SOLN
INTRAMUSCULAR | Status: AC
  Filled 2020-06-19: qty 2

## 2020-06-19 MED ORDER — FLEET ENEMA 7-19 GM/118ML RE ENEM: 1.0000 | ENEMA | Freq: Once | RECTAL | Status: DC | PRN

## 2020-06-19 MED ORDER — CHLORHEXIDINE GLUCONATE 0.12 % MT SOLN
15.0000 mL | Freq: Once | OROMUCOSAL | Status: AC
  Administered 2020-06-19: 15 mL via OROMUCOSAL
  Filled 2020-06-19: qty 15

## 2020-06-19 MED ORDER — SODIUM CHLORIDE 0.9 % IV SOLN
250.0000 mL | INTRAVENOUS | Status: DC
  Administered 2020-06-19: 250 mL via INTRAVENOUS

## 2020-06-19 MED ORDER — ONDANSETRON HCL 4 MG/2ML IJ SOLN: 4.0000 mg | Freq: Four times a day (QID) | INTRAMUSCULAR | Status: DC | PRN

## 2020-06-19 MED ORDER — MIDAZOLAM HCL 5 MG/5ML IJ SOLN
INTRAMUSCULAR | Status: DC | PRN
  Administered 2020-06-19: 2 mg via INTRAVENOUS

## 2020-06-19 MED ORDER — PROPOFOL 10 MG/ML IV BOLUS
INTRAVENOUS | Status: AC
  Filled 2020-06-19: qty 20

## 2020-06-19 MED ORDER — BUPIVACAINE HCL (PF) 0.25 % IJ SOLN
INTRAMUSCULAR | Status: AC
  Filled 2020-06-19: qty 30

## 2020-06-19 MED ORDER — SODIUM CHLORIDE 0.9% FLUSH: 3.0000 mL | INTRAVENOUS | Status: DC | PRN

## 2020-06-19 MED ORDER — ONDANSETRON HCL 4 MG PO TABS: 4.0000 mg | ORAL_TABLET | Freq: Four times a day (QID) | ORAL | Status: DC | PRN

## 2020-06-19 MED ORDER — ONDANSETRON HCL 4 MG/2ML IJ SOLN
INTRAMUSCULAR | Status: DC | PRN
  Administered 2020-06-19: 4 mg via INTRAVENOUS

## 2020-06-19 MED ORDER — EPHEDRINE 5 MG/ML INJ
INTRAVENOUS | Status: AC
  Filled 2020-06-19: qty 10

## 2020-06-19 MED ORDER — DEXAMETHASONE SODIUM PHOSPHATE 10 MG/ML IJ SOLN
INTRAMUSCULAR | Status: AC
  Filled 2020-06-19: qty 1

## 2020-06-19 MED ORDER — HYDROCHLOROTHIAZIDE 25 MG PO TABS: 25.0000 mg | ORAL_TABLET | Freq: Every day | ORAL | Status: DC

## 2020-06-19 MED ORDER — CEFAZOLIN SODIUM-DEXTROSE 2-4 GM/100ML-% IV SOLN
2.0000 g | INTRAVENOUS | Status: AC
  Administered 2020-06-19: 2 g via INTRAVENOUS
  Filled 2020-06-19: qty 100

## 2020-06-19 MED ORDER — ACETAMINOPHEN 650 MG RE SUPP: 650.0000 mg | RECTAL | Status: DC | PRN

## 2020-06-19 MED ORDER — FENTANYL CITRATE (PF) 250 MCG/5ML IJ SOLN
INTRAMUSCULAR | Status: AC
  Filled 2020-06-19: qty 5

## 2020-06-19 MED ORDER — OXYCODONE HCL 5 MG PO TABS
10.0000 mg | ORAL_TABLET | ORAL | Status: DC | PRN
  Administered 2020-06-19 – 2020-06-20 (×4): 10 mg via ORAL
  Filled 2020-06-19 (×4): qty 2

## 2020-06-19 MED ORDER — PHENYLEPHRINE 40 MCG/ML (10ML) SYRINGE FOR IV PUSH (FOR BLOOD PRESSURE SUPPORT)
PREFILLED_SYRINGE | INTRAVENOUS | Status: AC
  Filled 2020-06-19: qty 10

## 2020-06-19 MED ORDER — ACETAMINOPHEN 325 MG PO TABS
650.0000 mg | ORAL_TABLET | ORAL | Status: DC | PRN
  Administered 2020-06-19 – 2020-06-20 (×2): 650 mg via ORAL
  Filled 2020-06-19 (×2): qty 2

## 2020-06-19 MED ORDER — MENTHOL 3 MG MT LOZG: 1.0000 | LOZENGE | OROMUCOSAL | Status: DC | PRN

## 2020-06-19 MED ORDER — DEXAMETHASONE SODIUM PHOSPHATE 10 MG/ML IJ SOLN
10.0000 mg | Freq: Once | INTRAMUSCULAR | Status: AC
  Administered 2020-06-19: 10 mg via INTRAVENOUS
  Filled 2020-06-19: qty 1

## 2020-06-19 MED ORDER — ACETAMINOPHEN 10 MG/ML IV SOLN
INTRAVENOUS | Status: AC
  Filled 2020-06-19: qty 100

## 2020-06-19 MED ORDER — LISINOPRIL 20 MG PO TABS: 20.0000 mg | ORAL_TABLET | Freq: Every day | ORAL | Status: DC

## 2020-06-19 MED ORDER — PROPOFOL 10 MG/ML IV BOLUS
INTRAVENOUS | Status: DC | PRN
  Administered 2020-06-19: 150 mg via INTRAVENOUS

## 2020-06-19 MED ORDER — 0.9 % SODIUM CHLORIDE (POUR BTL) OPTIME
TOPICAL | Status: DC | PRN
  Administered 2020-06-19: 1000 mL

## 2020-06-19 MED ORDER — DIAZEPAM 5 MG PO TABS: 5.0000 mg | ORAL_TABLET | Freq: Four times a day (QID) | ORAL | Status: DC | PRN

## 2020-06-19 MED ORDER — LACTATED RINGERS IV SOLN: INTRAVENOUS | Status: DC

## 2020-06-19 MED ORDER — SODIUM CHLORIDE 0.9% FLUSH: 3.0000 mL | Freq: Two times a day (BID) | INTRAVENOUS | Status: DC

## 2020-06-19 MED ORDER — GABAPENTIN 400 MG PO CAPS
800.0000 mg | ORAL_CAPSULE | Freq: Three times a day (TID) | ORAL | Status: DC
  Administered 2020-06-19 – 2020-06-20 (×2): 800 mg via ORAL
  Filled 2020-06-19 (×2): qty 2

## 2020-06-19 MED ORDER — EPHEDRINE SULFATE-NACL 50-0.9 MG/10ML-% IV SOSY
PREFILLED_SYRINGE | INTRAVENOUS | Status: DC | PRN
  Administered 2020-06-19: 10 mg via INTRAVENOUS

## 2020-06-19 MED ORDER — ROCURONIUM BROMIDE 10 MG/ML (PF) SYRINGE
PREFILLED_SYRINGE | INTRAVENOUS | Status: DC | PRN
  Administered 2020-06-19: 60 mg via INTRAVENOUS
  Administered 2020-06-19: 40 mg via INTRAVENOUS

## 2020-06-19 MED ORDER — POLYETHYLENE GLYCOL 3350 17 G PO PACK: 17.0000 g | PACK | Freq: Every day | ORAL | Status: DC | PRN

## 2020-06-19 MED ORDER — HYDROCODONE-ACETAMINOPHEN 10-325 MG PO TABS: 1.0000 | ORAL_TABLET | ORAL | Status: DC | PRN

## 2020-06-19 MED ORDER — THROMBIN 20000 UNITS EX SOLR
CUTANEOUS | Status: DC | PRN
  Administered 2020-06-19 (×2): 20 mL via TOPICAL

## 2020-06-19 MED ORDER — LIDOCAINE 2% (20 MG/ML) 5 ML SYRINGE
INTRAMUSCULAR | Status: DC | PRN
  Administered 2020-06-19: 100 mg via INTRAVENOUS

## 2020-06-19 MED ORDER — VANCOMYCIN HCL 1000 MG IV SOLR
INTRAVENOUS | Status: DC | PRN
  Administered 2020-06-19: 1000 mg

## 2020-06-19 MED ORDER — ONDANSETRON HCL 4 MG/2ML IJ SOLN: 4.0000 mg | Freq: Once | INTRAMUSCULAR | Status: DC | PRN

## 2020-06-19 MED ORDER — BISACODYL 10 MG RE SUPP: 10.0000 mg | Freq: Every day | RECTAL | Status: DC | PRN

## 2020-06-19 MED ORDER — OXYCODONE HCL 5 MG PO TABS: 5.0000 mg | ORAL_TABLET | Freq: Once | ORAL | Status: DC | PRN

## 2020-06-19 MED ORDER — PANTOPRAZOLE SODIUM 40 MG PO TBEC
40.0000 mg | DELAYED_RELEASE_TABLET | Freq: Every day | ORAL | Status: DC
  Administered 2020-06-20: 40 mg via ORAL
  Filled 2020-06-19: qty 1

## 2020-06-19 MED ORDER — LIDOCAINE 2% (20 MG/ML) 5 ML SYRINGE
INTRAMUSCULAR | Status: AC
  Filled 2020-06-19: qty 5

## 2020-06-19 MED ORDER — SODIUM CHLORIDE 0.9 % IV SOLN: INTRAVENOUS | Status: DC | PRN

## 2020-06-19 MED ORDER — ACETAMINOPHEN 10 MG/ML IV SOLN
INTRAVENOUS | Status: DC | PRN
  Administered 2020-06-19: 1000 mg via INTRAVENOUS

## 2020-06-19 MED ORDER — PHENYLEPHRINE HCL-NACL 10-0.9 MG/250ML-% IV SOLN
INTRAVENOUS | Status: DC | PRN
  Administered 2020-06-19: 25 ug/min via INTRAVENOUS

## 2020-06-19 MED ORDER — ROCURONIUM BROMIDE 10 MG/ML (PF) SYRINGE
PREFILLED_SYRINGE | INTRAVENOUS | Status: AC
  Filled 2020-06-19: qty 10

## 2020-06-19 MED ORDER — PHENOL 1.4 % MT LIQD: 1.0000 | OROMUCOSAL | Status: DC | PRN

## 2020-06-19 SURGICAL SUPPLY — 75 items
BAG DECANTER FOR FLEXI CONT (MISCELLANEOUS) ×3 IMPLANT
BENZOIN TINCTURE PRP APPL 2/3 (GAUZE/BANDAGES/DRESSINGS) ×3 IMPLANT
BLADE CLIPPER SURG (BLADE) IMPLANT
BUR CUTTER 7.0 ROUND (BURR) IMPLANT
BUR MATCHSTICK NEURO 3.0 LAGG (BURR) ×3 IMPLANT
CAGE EXP CATALYFT 9 (Plate) ×12 IMPLANT
CANISTER SUCT 3000ML PPV (MISCELLANEOUS) ×3 IMPLANT
CAP LCK SPNE (Orthopedic Implant) ×6 IMPLANT
CAP LOCK SPINE RADIUS (Orthopedic Implant) ×6 IMPLANT
CAP LOCKING (Orthopedic Implant) ×12 IMPLANT
CARTRIDGE OIL MAESTRO DRILL (MISCELLANEOUS) ×1 IMPLANT
CLOSURE STERI-STRIP 1/2X4 (GAUZE/BANDAGES/DRESSINGS) ×1
CLOSURE WOUND 1/2 X4 (GAUZE/BANDAGES/DRESSINGS) ×2
CLSR STERI-STRIP ANTIMIC 1/2X4 (GAUZE/BANDAGES/DRESSINGS) ×2 IMPLANT
CNTNR URN SCR LID CUP LEK RST (MISCELLANEOUS) ×1 IMPLANT
CONT SPEC 4OZ STRL OR WHT (MISCELLANEOUS) ×2
COVER BACK TABLE 60X90IN (DRAPES) ×3 IMPLANT
COVER WAND RF STERILE (DRAPES) ×3 IMPLANT
DECANTER SPIKE VIAL GLASS SM (MISCELLANEOUS) ×3 IMPLANT
DERMABOND ADVANCED (GAUZE/BANDAGES/DRESSINGS) ×2
DERMABOND ADVANCED .7 DNX12 (GAUZE/BANDAGES/DRESSINGS) ×1 IMPLANT
DIFFUSER DRILL AIR PNEUMATIC (MISCELLANEOUS) ×3 IMPLANT
DRAPE C-ARM 42X72 X-RAY (DRAPES) ×6 IMPLANT
DRAPE HALF SHEET 40X57 (DRAPES) IMPLANT
DRAPE LAPAROTOMY 100X72X124 (DRAPES) ×3 IMPLANT
DRAPE SURG 17X23 STRL (DRAPES) ×12 IMPLANT
DRSG OPSITE POSTOP 4X6 (GAUZE/BANDAGES/DRESSINGS) IMPLANT
DRSG OPSITE POSTOP 4X8 (GAUZE/BANDAGES/DRESSINGS) ×6 IMPLANT
DURAPREP 26ML APPLICATOR (WOUND CARE) ×3 IMPLANT
ELECT REM PT RETURN 9FT ADLT (ELECTROSURGICAL) ×3
ELECTRODE REM PT RTRN 9FT ADLT (ELECTROSURGICAL) ×1 IMPLANT
EVACUATOR 1/8 PVC DRAIN (DRAIN) ×3 IMPLANT
GAUZE 4X4 16PLY RFD (DISPOSABLE) IMPLANT
GAUZE SPONGE 4X4 12PLY STRL (GAUZE/BANDAGES/DRESSINGS) IMPLANT
GLOVE BIO SURGEON STRL SZ 6.5 (GLOVE) ×2 IMPLANT
GLOVE BIO SURGEONS STRL SZ 6.5 (GLOVE) ×1
GLOVE BIOGEL PI IND STRL 6.5 (GLOVE) ×1 IMPLANT
GLOVE BIOGEL PI IND STRL 7.0 (GLOVE) ×2 IMPLANT
GLOVE BIOGEL PI IND STRL 7.5 (GLOVE) ×3 IMPLANT
GLOVE BIOGEL PI IND STRL 8 (GLOVE) ×1 IMPLANT
GLOVE BIOGEL PI INDICATOR 6.5 (GLOVE) ×2
GLOVE BIOGEL PI INDICATOR 7.0 (GLOVE) ×4
GLOVE BIOGEL PI INDICATOR 7.5 (GLOVE) ×6
GLOVE BIOGEL PI INDICATOR 8 (GLOVE) ×2
GLOVE ECLIPSE 7.5 STRL STRAW (GLOVE) ×3 IMPLANT
GLOVE ECLIPSE 9.0 STRL (GLOVE) ×6 IMPLANT
GLOVE SURG SS PI 7.0 STRL IVOR (GLOVE) ×15 IMPLANT
GOWN STRL REUS W/ TWL LRG LVL3 (GOWN DISPOSABLE) ×1 IMPLANT
GOWN STRL REUS W/ TWL XL LVL3 (GOWN DISPOSABLE) ×4 IMPLANT
GOWN STRL REUS W/TWL 2XL LVL3 (GOWN DISPOSABLE) ×3 IMPLANT
GOWN STRL REUS W/TWL LRG LVL3 (GOWN DISPOSABLE) ×2
GOWN STRL REUS W/TWL XL LVL3 (GOWN DISPOSABLE) ×8
KIT BASIN OR (CUSTOM PROCEDURE TRAY) ×3 IMPLANT
KIT TURNOVER KIT B (KITS) ×3 IMPLANT
MILL MEDIUM DISP (BLADE) ×3 IMPLANT
NEEDLE HYPO 22GX1.5 SAFETY (NEEDLE) ×3 IMPLANT
NS IRRIG 1000ML POUR BTL (IV SOLUTION) ×3 IMPLANT
OIL CARTRIDGE MAESTRO DRILL (MISCELLANEOUS) ×3
PACK LAMINECTOMY NEURO (CUSTOM PROCEDURE TRAY) ×3 IMPLANT
PASTE BONE GRAFTON 1CC (Bone Implant) ×6 IMPLANT
PATTIES SURGICAL 1X1 (DISPOSABLE) ×3 IMPLANT
ROD 5.5X60MM PURPLE (Rod) ×6 IMPLANT
SCREW 5.75X40M (Screw) ×12 IMPLANT
SCREW 5.75X45MM (Screw) ×6 IMPLANT
SPONGE LAP 4X18 RFD (DISPOSABLE) ×3 IMPLANT
SPONGE SURGIFOAM ABS GEL 100 (HEMOSTASIS) ×6 IMPLANT
STRIP CLOSURE SKIN 1/2X4 (GAUZE/BANDAGES/DRESSINGS) ×4 IMPLANT
SUT VIC AB 0 CT1 18XCR BRD8 (SUTURE) ×2 IMPLANT
SUT VIC AB 0 CT1 8-18 (SUTURE) ×4
SUT VIC AB 2-0 CT1 18 (SUTURE) ×3 IMPLANT
SUT VIC AB 3-0 SH 8-18 (SUTURE) ×6 IMPLANT
TOWEL GREEN STERILE (TOWEL DISPOSABLE) ×3 IMPLANT
TOWEL GREEN STERILE FF (TOWEL DISPOSABLE) ×3 IMPLANT
TRAY FOLEY MTR SLVR 16FR STAT (SET/KITS/TRAYS/PACK) ×3 IMPLANT
WATER STERILE IRR 1000ML POUR (IV SOLUTION) ×3 IMPLANT

## 2020-06-19 NOTE — Op Note (Signed)
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  L4-5 and L5-S1 posterior lumbar interbody fusion utilizing interbody cages and local autograft  L4-5 and L5-S1 ponte osteotomies for restoration of sagittal plane balance  L4-5 S1 posterior lateral arthrodesis utilizing segmental pedicle screw fixation and local autograft  Surgeon:Avrielle Fry A.Takeria Marquina, M.D.  Asst. Surgeon: Doran Durand, NP  Anesthesia: General  Indication: 64 year old male with severe back and bilateral lower extremity pain failing conservative manage.  Work-up demonstrates evidence of severe disc degeneration with degenerative spondylolisthesis at L4-5 with grade 1 anterior listhesis and severe foraminal stenosis and moderately severe lateral recess stenosis and spinal stenosis on the left.  At L5-S1 the patient has evidence of severe degeneration with degenerative retrolisthesis and severe neural foraminal stenosis and left-sided sided spondylosis and lateral recess stenosis.  Patient presents now for two-level lumbar decompression and fusion in hopes of improving his symptoms.  Operative note: After induction of anesthesia, patient position prone onto Wilson frame and properly padded.  Lumbar region prepped and draped sterilely.  Incision made overlying L4-5 and S1.  Dissection per discectomy was then formed bilaterally.  Retractor placed.  Fluoroscopy used.  Levels confirmed.  Decompressive laminotomies and facetectomies were then performed bilaterally at L4-5 and L5-S1 to remove the inferior two thirds of the lamina of L4 and L5 the entire pars interarticularis and inferior facet of L4 and  L5.  The superior facet of L5 and S1 were also removed bilaterally.  Ligament flavum elevated and resected.  Wide lateral posterior releases were performed completing ponte osteotomies which allowed for greater mobilization and correction of the patient's sagittal plane deformity.  The patient's disc spaces were then incised and discectomies were performed bilaterally.  This patient then prepared for interbody fusion.  Starting first on the left side at L5-S1 a retractor/distractor was placed in the patient's right side.  The space on the left side was prepared for interbody fusion.  Soft tissue was removed.  A 9 mm Medtronic expandable cage was then impacted into place and expanded.  Distractor removed patient's right side.  The space prepared on the right side.  Morselized autograft packed throughout the interspace.  A second cage was then impacted into place and expanded to its full extent.  Each cage was then filled with graft done bone putty.  Procedure then repeated again using 9 mm cages at L4-5 bilaterally and once again using autograft and demineralized bone matrix.  Pedicles of L4-L5 and S1 were identified using surface landmarks and intraoperative fluoroscopy with suture bone around the pedicle was then removed using high-speed drill.  Pedicle was then probed using pedicle all each pedicle tract was probed and found to be solidly within the bone.  Each pedicle tract was then tapped with a screw tap.  Screw temple was then probed and found to be solidly within the bone.  5.75 mm radius brand screws from [employer] were placed bilaterally at L4-L5 and S1.  Final images reveal good position of cages and the hardware at the proper upper level with normal alignment of the spine.  Wound is irrigated one final time.  Transverse processes and sacral ala were decorticated.  Morselized autograft was packed posterior laterally.  Short segment titanium rod placed over the  screw heads at L4-5 and S1.  Locking  caps placed to the previous locking caps then engaged with a construct under compression.  Gelfoam placed over the laminotomy defect.  Vancomycin powder placed in deep wound space.  A medium Hemovac drain was left in the deep wound space.  Wound is then closed in layers with Vicryl sutures.  Steri-Strips and sterile dressing were applied.  No apparent complications.  Patient tolerated the procedure well and he returned to the recovery room postop.

## 2020-06-19 NOTE — Progress Notes (Signed)
Orthopedic Tech Progress Note Patient Details:  Jonathon Zimmerman 1955-12-31 [id number] Called in order to [facility] for an ASPEN LUMBAR BRACE Patient ID: Jonathon Zimmerman, male   DOB: 1956/03/11, 63 y.o.   MRN: [id number]   [name] 06/19/2020, 3:07 PM

## 2020-06-19 NOTE — Anesthesia Postprocedure Evaluation (Signed)
Anesthesia Post Note  Patient: Estill Llerena  Procedure(s) Performed: POSTERIOR LUMBAR INTERBODY FUSION - LUMBAR FOUR-LUMBAR FIVE - LUMBAR FIVE-SACRAL ONE (N/A Back)     Patient location during evaluation: PACU Anesthesia Type: General Level of consciousness: awake and alert Pain management: pain level controlled Vital Signs Assessment: post-procedure vital signs reviewed and stable Respiratory status: spontaneous breathing, nonlabored ventilation, respiratory function stable and patient connected to nasal cannula oxygen Cardiovascular status: blood pressure returned to baseline and stable Postop Assessment: no apparent nausea or vomiting Anesthetic complications: no   No complications documented.  Last Vitals:  Vitals:   06/19/20 1645 06/19/20 1710  BP: (!) 84/61 95/68  Pulse: 63 63  Resp: 12 17  Temp: 36.7 C 36.4 C  SpO2: 100% 98%    Last Pain:  Vitals:   06/19/20 1710  TempSrc: Oral  PainSc:                  Shelton Silvas

## 2020-06-19 NOTE — Anesthesia Preprocedure Evaluation (Addendum)
Anesthesia Evaluation  Patient identified by MRN, date of birth, ID band Patient awake    Reviewed: Allergy & Precautions, NPO status , Patient's Chart, lab work & pertinent test results  History of Anesthesia Complications Negative for: history of anesthetic complications  Airway Mallampati: II  TM Distance: >3 FB Neck ROM: Limited  Mouth opening: Limited Mouth Opening  Dental  (+) Teeth Intact   Pulmonary neg pulmonary ROS,    Pulmonary exam normal        Cardiovascular Exercise Tolerance: Good hypertension, Normal cardiovascular exam     Neuro/Psych negative psych ROS   GI/Hepatic Neg liver ROS, GERD  ,  Endo/Other  negative endocrine ROS  Renal/GU negative Renal ROS  negative genitourinary   Musculoskeletal negative musculoskeletal ROS (+)   Abdominal   Peds  Hematology negative hematology ROS (+)   Anesthesia Other Findings  "Cardiac risk sratification: Excellent baseline functional capacity without any symptoms of angina or angina equivalent. Normal physical exam. 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 His perioperative cardiac risk is low. No further testing is necessary. Patient can proceed with spinal surgery as scheduled on 06/19/2020.  Reproductive/Obstetrics                            Anesthesia Physical Anesthesia Plan  ASA: II  Anesthesia Plan: General   Post-op Pain Management:    Induction: Intravenous  PONV Risk Score and Plan: 3 and Ondansetron, Dexamethasone, Treatment may vary due to age or medical condition and Midazolam  Airway Management Planned: Oral ETT  Additional Equipment: None  Intra-op Plan:   Post-operative Plan: Extubation in OR  Informed Consent: I have reviewed the patients History and Physical, chart, labs and discussed the procedure including the risks, benefits and  alternatives for the proposed anesthesia with the patient or authorized representative who has indicated his/her understanding and acceptance.     Dental advisory given  Plan Discussed with:   Anesthesia Plan Comments:        Anesthesia Quick Evaluation

## 2020-06-19 NOTE — H&P (Signed)
Jonathon Zimmerman is an 64 y.o. male.   Chief Complaint: Back pain HPI: 64 year old male with back and bilateral lower extremity symptoms which have failed conservative management over time.  Work-up demonstrates evidence of L4-5 degenerative spondylolisthesis with significant lateral recess and foraminal stenosis right greater than left.  Patient also with marked disc degeneration and degenerative retrolisthesis of L5 on S1 with severe neural foraminal stenosis and left-sided lateral recess and spinal stenosis.  Patient has failed conservative management.  He presents now for two-level lumbar decompression and fusion in hopes of improving his symptoms.  Past Medical History:  Diagnosis Date  . Cervical disc disorder   . Dysphagia   . GERD (gastroesophageal reflux disease)   . Hypertension   . IDA (iron deficiency anemia)   . Spinal stenosis     Past Surgical History:  Procedure Laterality Date  . EYE SURGERY Bilateral 2004   Lasix  . JOINT REPLACEMENT    . neck surgery    . REPLACEMENT TOTAL KNEE Right   . VASECTOMY  1984    Family History  Problem Relation Age of Onset  . Hypertension Mother    Social History:  reports that he has never smoked. He has quit using smokeless tobacco.  His smokeless tobacco use included chew. He reports current alcohol use. He reports that he does not use drugs.  Allergies: No Known Allergies  Medications Prior to Admission  Medication Sig Dispense Refill  . gabapentin (NEURONTIN) 800 MG tablet Take 800 mg by mouth in the morning, at noon, and at bedtime.    . hydrochlorothiazide (HYDRODIURIL) 25 MG tablet Take 25 mg by mouth daily.    Marland Kitchen ibuprofen (ADVIL) 800 MG tablet Take 800 mg by mouth every 8 (eight) hours as needed for moderate pain.    Marland Kitchen lisinopril (ZESTRIL) 20 MG tablet Take 20 mg by mouth daily.    . pantoprazole (PROTONIX) 40 MG tablet Take 40 mg by mouth daily.     . polyethylene glycol (MIRALAX / GLYCOLAX) 17 g packet Take 17 g by  mouth daily.     . sildenafil (VIAGRA) 100 MG tablet Take 100 mg by mouth daily as needed for erectile dysfunction.       Results for orders placed or performed during the hospital encounter of 06/19/20 (from the past 48 hour(s))  ABO/Rh     Status: None   Collection Time: 06/19/20  8:30 AM  Result Value Ref Range   ABO/RH(D)      A POS Performed at [facility], [address], [place], [place] [address]    No results found.  Pertinent items noted in HPI and remainder of comprehensive ROS otherwise negative.  Blood pressure (!) 128/93, pulse 69, temperature 98 F (36.7 C), temperature source Oral, resp. rate 17, height 5\' 9"  (1.753 m), weight 88 kg, SpO2 100 %.  Patient is awake and alert.  He is oriented and appropriate.  Speech is fluent.  Judgment insight are intact.  Cranial nerve function normal bilateral.  Motor examination extremities reveals intact motor strength bilaterally.  Sensory examination with decreased sensation to pinprick and light touch in his L5 and S1 dermatomes bilateral.  Deep tendon revisit hypoactive but his Achilles reflexes are absent bilaterally.  Gait is antalgic.  Posture is flexed peer examination head ears eyes nose throat is unremarked.  Chest and abdomen are benign.  Extremities are free from injury deformity. Assessment/Plan L4-5, L5-S1 degenerative spondylolisthesis with significant stenosis and ongoing back  and radicular pain.  Plan bilateral L4-5 and L5-S1 decompressive laminotomies and foraminotomies followed by posterior lumbar interbody fusion utilizing interbody cages, locally harvested autograft, and augmented with posterior segmental pedicle screw fixation and local autografting.  Risks and benefits been explained.  Patient wishes to proceed.  Sherilyn Cooter A Taila Basinski 06/19/2020, 9:23 AM

## 2020-06-19 NOTE — Brief Op Note (Signed)
06/19/2020  1:27 PM  PATIENT:  Jonathon Zimmerman  64 y.o. male  PRE-OPERATIVE DIAGNOSIS:  Spondylolisthesis  POST-OPERATIVE DIAGNOSIS:  Spondylolisthesis  PROCEDURE:  Procedure(s): POSTERIOR LUMBAR INTERBODY FUSION - LUMBAR FOUR-LUMBAR FIVE - LUMBAR FIVE-SACRAL ONE (N/A)  SURGEON:  Surgeon(s) and Role:    * Julio Sicks, MD - Primary  PHYSICIAN ASSISTANT:   ASSISTANTSMarland Mcalpine   ANESTHESIA:   general  EBL:  500 mL   BLOOD ADMINISTERED:none  DRAINS: (med) Hemovact drain(s) in the epidural space with  Suction Open   LOCAL MEDICATIONS USED:  MARCAINE     SPECIMEN:  No Specimen  DISPOSITION OF SPECIMEN:  N/A  COUNTS:  YES  TOURNIQUET:  * No tourniquets in log *  DICTATION: .Dragon Dictation  PLAN OF CARE: Admit to inpatient   PATIENT DISPOSITION:  PACU - hemodynamically stable.   Delay start of Pharmacological VTE agent (>24hrs) due to surgical blood loss or risk of bleeding: yes

## 2020-06-19 NOTE — Anesthesia Procedure Notes (Signed)
Procedure Name: Intubation Date/Time: 06/19/2020 10:02 AM Performed by: Lovie Chol, CRNA Pre-anesthesia Checklist: Patient identified, Emergency Drugs available, Suction available and Patient being monitored Patient Re-evaluated:Patient Re-evaluated prior to induction Oxygen Delivery Method: Circle System Utilized Preoxygenation: Pre-oxygenation with 100% oxygen Induction Type: IV induction Ventilation: Mask ventilation without difficulty Laryngoscope Size: Miller and 3 Grade View: Grade I Tube type: Oral Tube size: 7.5 mm Number of attempts: 1 Airway Equipment and Method: Stylet and Oral airway Placement Confirmation: ETT inserted through vocal cords under direct vision,  positive ETCO2 and breath sounds checked- equal and bilateral Secured at: 21 cm Tube secured with: Tape Dental Injury: Teeth and Oropharynx as per pre-operative assessment

## 2020-06-19 NOTE — Transfer of Care (Signed)
Immediate Anesthesia Transfer of Care Note  Patient: Jonathon Zimmerman  Procedure(s) Performed: POSTERIOR LUMBAR INTERBODY FUSION - LUMBAR FOUR-LUMBAR FIVE - LUMBAR FIVE-SACRAL ONE (N/A Back)  Patient Location: PACU  Anesthesia Type:General  Level of Consciousness: oriented, drowsy and patient cooperative  Airway & Oxygen Therapy: Patient Spontanous Breathing and Patient connected to face mask oxygen  Post-op Assessment: Report given to RN and Post -op Vital signs reviewed and stable  Post vital signs: Reviewed  Last Vitals:  Vitals Value Taken Time  BP 103/61 06/19/20 1348  Temp    Pulse 73 06/19/20 1354  Resp 17 06/19/20 1354  SpO2 81 % 06/19/20 1354  Vitals shown include unvalidated device data.  Last Pain:  Vitals:   06/19/20 0825  TempSrc: Oral  PainSc: 6       Patients Stated Pain Goal: 3 (06/19/20 0825)  Complications: No complications documented.

## 2020-06-20 LAB — CBC
HCT: 27.7 % — ABNORMAL LOW (ref 39.0–52.0)
Hemoglobin: 9.6 g/dL — ABNORMAL LOW (ref 13.0–17.0)
MCH: 31 pg (ref 26.0–34.0)
MCHC: 34.7 g/dL (ref 30.0–36.0)
MCV: 89.4 fL (ref 80.0–100.0)
Platelets: 175 10*3/uL (ref 150–400)
RBC: 3.1 MIL/uL — ABNORMAL LOW (ref 4.22–5.81)
RDW: 11.7 % (ref 11.5–15.5)
WBC: 9.3 10*3/uL (ref 4.0–10.5)
nRBC: 0 % (ref 0.0–0.2)

## 2020-06-20 MED ORDER — OXYCODONE HCL 10 MG PO TABS: 10.0000 mg | ORAL_TABLET | ORAL | 0 refills | Status: DC | PRN

## 2020-06-20 MED ORDER — BISACODYL 10 MG RE SUPP: 10.0000 mg | Freq: Every day | RECTAL | 0 refills | Status: DC | PRN

## 2020-06-20 MED FILL — Thrombin For Soln 20000 Unit: CUTANEOUS | Qty: 1 | Status: AC

## 2020-06-20 NOTE — Evaluation (Signed)
Occupational Therapy Evaluation Patient Details Name: Jonathon Zimmerman MRN: [id number] DOB: 19-Dec-1955 Today's Date: 06/20/2020    History of Present Illness Pt is a 64 y/o male who presents s/p L4-S1 PLIF on 06/19/2020. PMH significant for HTN, dysphagia, R TKR, neck surgery.   Clinical Impression   Patient evaluated by Occupational Therapy with no further acute OT needs identified. All education has been completed and the patient has no further questions. See below for any follow-up Occupational Therapy or equipment needs. OT to sign off. Thank you for referral.      Follow Up Recommendations  No OT follow up    Equipment Recommendations  None recommended by OT;Other (comment) (AE to purchase)    Recommendations for Other Services       Precautions / Restrictions Precautions Precautions: Fall;Back Precaution Booklet Issued: Yes (comment) Precaution Comments: Reviewed verbally during functional mobility.  Required Braces or Orthoses: Spinal Brace Spinal Brace: Lumbar corset;Applied in sitting position Restrictions Weight Bearing Restrictions: No      Mobility Bed Mobility Overal bed mobility: Modified Independent             General bed mobility comments: educated on back precautions    Transfers Overall transfer level: Modified independent Equipment used: None;Rolling walker (2 wheeled)             General transfer comment: Pt demonstrated proper hand placement on seated surface for safety.     Balance Overall balance assessment: Mild deficits observed, not formally tested                                         ADL either performed or assessed with clinical judgement   ADL Overall ADL's : Needs assistance/impaired Eating/Feeding: Modified independent   Grooming: Oral care;Supervision/safety;Standing Grooming Details (indicate cue type and reason): use of cup for bending         Upper Body Dressing :  Supervision/safety;Sitting Upper Body Dressing Details (indicate cue type and reason): abel to don brace Lower Body Dressing: Min guard;Sit to/from stand;With adaptive equipment;Cueing for back precautions Lower Body Dressing Details (indicate cue type and reason): plans to get hip kit  Toilet Transfer: Min guard;RW       Tub/ Shower Transfer: Walk-in shower;Min Engineer, drilling Details (indicate cue type and reason): educated on transfering with R foot in and L foot out Functional mobility during ADLs: Min guard;Rolling walker    Back handout provided and reviewed adls in detail. Pt educated on: clothing between brace, never sleep in brace, set an alarm at night for medication, avoid sitting for long periods of time, correct bed positioning for sleeping, correct sequence for bed mobility, avoiding lifting more than 5 pounds and never wash directly over incision. All education is complete and patient indicates understanding.     Vision         Perception     Praxis      Pertinent Vitals/Pain Pain Assessment: 0-10 Pain Score: 4  Pain Location: incision site Pain Descriptors / Indicators: Operative site guarding Pain Intervention(s): Monitored during session;Premedicated before session;Repositioned     Hand Dominance Right   Extremity/Trunk Assessment Upper Extremity Assessment Upper Extremity Assessment: Overall WFL for tasks assessed   Lower Extremity Assessment Lower Extremity Assessment: Defer to PT evaluation LLE Deficits / Details: Decreased strength and muscular endurance consistent with pre-op diagnosis   Cervical / Trunk Assessment Cervical /  Trunk Assessment: Other exceptions Cervical / Trunk Exceptions: s/p surgery   Communication Communication Communication: No difficulties   Cognition Arousal/Alertness: Awake/alert Behavior During Therapy: WFL for tasks assessed/performed Overall Cognitive Status: Within Functional Limits for tasks  assessed                                     General Comments  educated on use of clean dry linen for all bathing    Exercises     Shoulder Instructions      Home Living Family/patient expects to be discharged to:: Private residence Living Arrangements: Spouse/significant other Available Help at Discharge: Family;Available 24 hours/day Type of Home: House Home Access: Stairs to enter [employer] of Steps: 3 Entrance Stairs-Rails: Left Home Layout: Two level;Able to live on main level with bedroom/bathroom     Bathroom Shower/Tub: Occupational psychologist: Standard     Home Equipment: Walker - 2 wheels          Prior Functioning/Environment Level of Independence: Independent                 OT Problem List: Impaired balance (sitting and/or standing);Decreased activity tolerance;Decreased safety awareness;Decreased knowledge of use of DME or AE;Decreased knowledge of precautions;Pain      OT Treatment/Interventions:      OT Goals(Current goals can be found in the care plan section) Acute Rehab OT Goals Patient Stated Goal: home today OT Goal Formulation: All assessment and education complete, DC therapy Potential to Achieve Goals: Good  OT Frequency:     Barriers to D/C:            Co-evaluation              AM-PAC OT "6 Clicks" Daily Activity     Outcome Measure Help from another person eating meals?: None Help from another person taking care of personal grooming?: None Help from another person toileting, which includes using toliet, bedpan, or urinal?: None Help from another person bathing (including washing, rinsing, drying)?: None Help from another person to put on and taking off regular upper body clothing?: None Help from another person to put on and taking off regular lower body clothing?: None 6 Click Score: 24   End of Session Equipment Utilized During Treatment: Back brace Nurse Communication:  Mobility status;Precautions  Activity Tolerance: Patient tolerated treatment well Patient left: in chair;with call bell/phone within reach  OT Visit Diagnosis: Unsteadiness on feet (R26.81)                Time: 6599-3570 OT Time Calculation (min): 35 min Charges:  OT General Charges $OT Visit: 1 Visit OT Evaluation $OT Eval Moderate Complexity: 1 Mod OT Treatments $Self Care/Home Management : 8-22 mins   Brynn, OTR/L  Acute Rehabilitation Services Pager: [phone] Office: [phone] .   Jeri Modena 06/20/2020, 4:38 PM

## 2020-06-20 NOTE — Discharge Summary (Signed)
Physician Discharge Summary  Patient ID: Jonathon Zimmerman MRN: [id number] DOB/AGE: 04/23/1956 64 y.o.  Admit date: 06/19/2020 Discharge date: 06/20/2020  Admission Diagnoses:  Discharge Diagnoses:  Active Problems:   Degenerative spondylolisthesis   Discharged Condition: good  Hospital Course: Patient mated to the hospital where he underwent uncomplicated L4-5 and L5-S1 decompression and fusion.  Initially postoperatively the patient had some difficulty with orthostatic hypotension but this is resolved.  He is now having minimal pain.  He is having no lower extremity symptoms.  He is ambulating standing and voiding without difficulty.  He is ready for discharge home.   Significant Diagnostic Studies:   Treatments:   Discharge Exam: Blood pressure 95/62, pulse 65, temperature 98 F (36.7 C), temperature source Oral, resp. rate 17, height 5\' 9"  (1.753 m), weight 88 kg, SpO2 99 %. Patient is awake and alert.  He is oriented and appropriate.  His speech is fluent.  Judgment insight are intact.  Motor and sensory function extremities normal.  Wound clean and dry.  Chest and abdomen benign.  Abdomen soft.   Disposition: Discharge disposition: 01-Home or Self Care        Allergies as of 06/20/2020   No Known Allergies     Medication List    TAKE these medications   bisacodyl 10 MG suppository Commonly known as: DULCOLAX Place 1 suppository (10 mg total) rectally daily as needed for moderate constipation.   gabapentin 800 MG tablet Commonly known as: NEURONTIN Take 800 mg by mouth in the morning, at noon, and at bedtime.   hydrochlorothiazide 25 MG tablet Commonly known as: HYDRODIURIL Take 25 mg by mouth daily.   ibuprofen 800 MG tablet Commonly known as: ADVIL Take 800 mg by mouth every 8 (eight) hours as needed for moderate pain.   lisinopril 20 MG tablet Commonly known as: ZESTRIL Take 20 mg by mouth daily.   Oxycodone HCl 10 MG Tabs Take 1 tablet (10 mg total)  by mouth every 3 (three) hours as needed for severe pain ((score 7 to 10)).   pantoprazole 40 MG tablet Commonly known as: PROTONIX Take 40 mg by mouth daily.   polyethylene glycol 17 g packet Commonly known as: MIRALAX / GLYCOLAX Take 17 g by mouth daily.   sildenafil 100 MG tablet Commonly known as: VIAGRA Take 100 mg by mouth daily as needed for erectile dysfunction.            Durable Medical Equipment  (From admission, onward)         Start     Ordered   06/19/20 1719  DME Walker rolling  Once       Question:  Patient needs a walker to treat with the following condition  Answer:  Sagittal plane imbalance   06/19/20 1718   06/19/20 1719  DME 3 n 1  Once        06/19/20 1718           Signed: 13/01/21 A Caprisha Bridgett 06/20/2020, 9:59 AM

## 2020-06-20 NOTE — Discharge Instructions (Addendum)
Wound Care Keep incision covered and dry for two - three days.   Do not put any creams, lotions, or ointments on incision. Leave steri-strips on back.  They will fall off by themselves. Activity Walk each and every day, increasing distance each day. No lifting greater than 5 lbs.  Avoid excessive neck motion. No driving for 2 weeks; may ride as a passenger locally. If provided with back brace, wear when out of bed.  It is not necessary to wear brace in bed. Diet Resume your normal diet.  Return to Work Will be discussed at you follow up appointment. Call Your Doctor If Any of These Occur Redness, drainage, or swelling at the wound.  Temperature greater than 101 degrees. Severe pain not relieved by pain medication. Incision starts to come apart. Follow Up Appt Call today for appointment in 1-2 weeks ([phone]) or for problems.  If you have any hardware placed in your spine, you will need an x-ray before your appointment.  

## 2020-06-20 NOTE — Evaluation (Signed)
Physical Therapy Evaluation Patient Details Name: Jonathon Zimmerman MRN: [id number] DOB: 08-08-1956 Today's Date: 06/20/2020   History of Present Illness  Pt is a 64 y/o male who presents s/p L4-S1 PLIF on 06/19/2020. PMH significant for HTN, dysphagia, R TKR, neck surgery.    Clinical Impression  Patient evaluated by Physical Therapy with no further acute PT needs identified. All education has been completed and the patient has no further questions. Pt was able to demonstrate transfers and ambulation with gross modified independence to supervision for safety with the RW for support. Pt was educated on precautions, brace application/wearing schedule, appropriate activity progression, and car transfer. See below for any follow-up Physical Therapy or equipment needs. PT is signing off. Thank you for this referral.     Follow Up Recommendations No PT follow up    Equipment Recommendations  None recommended by PT    Recommendations for Other Services       Precautions / Restrictions Precautions Precautions: Fall;Back Precaution Booklet Issued: Yes (comment) Precaution Comments: Reviewed verbally during functional mobility.  Required Braces or Orthoses: Spinal Brace Spinal Brace: Lumbar corset;Applied in sitting position Restrictions Weight Bearing Restrictions: No      Mobility  Bed Mobility               General bed mobility comments: Pt was received sitting up in the recliner    Transfers Overall transfer level: Modified independent Equipment used: None;Rolling walker (2 wheeled)             General transfer comment: Pt demonstrated proper hand placement on seated surface for safety.   Ambulation/Gait Ambulation/Gait assistance: Supervision;Min guard Gait Distance (Feet): 300 Feet Assistive device: Rolling walker (2 wheeled);None Gait Pattern/deviations: Step-through pattern;Decreased stride length;Trunk flexed Gait velocity: Decreased Gait velocity  interpretation: <1.8 ft/sec, indicate of risk for recurrent falls General Gait Details: Initially pt without AD as he wanted to try it and see how he felt. Pt required min guard assist and pt was slow and guarded, requiring min guard assist for safety. With the RW, pt progressed to supervision for safety and was able to improve gait speed and overall quality of gait cycle.   Stairs Stairs: Yes Stairs assistance: Min guard Stair Management: One rail Left;Step to pattern;Forwards Number of Stairs: 4 General stair comments: VC's for sequencing and general safety. No assist required however close guard provided for safety.   Wheelchair Mobility    Modified Rankin (Stroke Patients Only)       Balance Overall balance assessment: Mild deficits observed, not formally tested                                           Pertinent Vitals/Pain Pain Assessment: 0-10 Pain Score: 4  Pain Location: incision site Pain Descriptors / Indicators: Operative site guarding Pain Intervention(s): Limited activity within patient's tolerance;Monitored during session;Repositioned    Home Living Family/patient expects to be discharged to:: Private residence Living Arrangements: Spouse/significant other Available Help at Discharge: Family;Available 24 hours/day Type of Home: House Home Access: Stairs to enter Entrance Stairs-Rails: Left Entrance Stairs-Number of Steps: 3 Home Layout: Two level;Able to live on main level with bedroom/bathroom Home Equipment: Walker - 2 wheels      Prior Function Level of Independence: Independent               Hand Dominance  Extremity/Trunk Assessment   Upper Extremity Assessment Upper Extremity Assessment: Defer to OT evaluation    Lower Extremity Assessment Lower Extremity Assessment: LLE deficits/detail LLE Deficits / Details: Decreased strength and muscular endurance consistent with pre-op diagnosis    Cervical / Trunk  Assessment Cervical / Trunk Assessment: Other exceptions Cervical / Trunk Exceptions: s/p surgery  Communication   Communication: No difficulties  Cognition Arousal/Alertness: Awake/alert Behavior During Therapy: WFL for tasks assessed/performed Overall Cognitive Status: Within Functional Limits for tasks assessed                                        General Comments      Exercises     Assessment/Plan    PT Assessment Patent does not need any further PT services  PT Problem List         PT Treatment Interventions      PT Goals (Current goals can be found in the Care Plan section)  Acute Rehab PT Goals Patient Stated Goal: home today PT Goal Formulation: All assessment and education complete, DC therapy    Frequency     Barriers to discharge        Co-evaluation               AM-PAC PT "6 Clicks" Mobility  Outcome Measure Help needed turning from your back to your side while in a flat bed without using bedrails?: None Help needed moving from lying on your back to sitting on the side of a flat bed without using bedrails?: None Help needed moving to and from a bed to a chair (including a wheelchair)?: None Help needed standing up from a chair using your arms (e.g., wheelchair or bedside chair)?: None Help needed to walk in hospital room?: None Help needed climbing 3-5 steps with a railing? : A Little 6 Click Score: 23    End of Session Equipment Utilized During Treatment: Gait belt;Back brace Activity Tolerance: Patient tolerated treatment well Patient left: with call bell/phone within reach;with nursing/sitter in room (Sitting EOB with RN removing drain) Nurse Communication: Mobility status      Time: 0935-1005 PT Time Calculation (min) (ACUTE ONLY): 30 min   Charges:   PT Evaluation $PT Eval Low Complexity: 1 Low PT Treatments $Gait Training: 8-22 mins         Jonathon Zimmerman, PT, DPT Acute Rehabilitation Services Pager:  [phone] Office: [phone]   Jonathon Zimmerman 06/20/2020, 3:14 PM

## 2020-06-20 NOTE — Plan of Care (Signed)
Patient alert and oriented, mae's well, voiding adequate amount of urine, swallowing without difficulty, no c/o pain at time of discharge. Patient discharged home with family. Script and discharged instructions given to patient. Patient and family stated understanding of instructions given. Patient has an appointment with Dr. pOOL

## 2020-06-20 NOTE — Progress Notes (Signed)
CSW left voicemail for Luvenia Redden transfer coordinator notifying her of patient's admission.   Burak Zerbe LCSW

## 2020-06-21 MED FILL — Sodium Chloride IV Soln 0.9%: INTRAVENOUS | Qty: 1000 | Status: AC

## 2020-06-21 MED FILL — Heparin Sodium (Porcine) Inj 1000 Unit/ML: INTRAMUSCULAR | Qty: 30 | Status: AC

## 2020-06-21 MED FILL — Sodium Chloride Irrigation Soln 0.9%: Qty: 3000 | Status: AC

## 2020-11-01 ENCOUNTER — Other Ambulatory Visit: Payer: Self-pay

## 2020-11-01 ENCOUNTER — Encounter: Payer: Self-pay | Admitting: Rehabilitative and Restorative Service Providers"

## 2020-11-01 ENCOUNTER — Ambulatory Visit (INDEPENDENT_AMBULATORY_CARE_PROVIDER_SITE_OTHER): Payer: No Typology Code available for payment source | Admitting: Rehabilitative and Restorative Service Providers"

## 2020-11-01 DIAGNOSIS — M545 Low back pain, unspecified: Secondary | ICD-10-CM | POA: Diagnosis not present

## 2020-11-01 DIAGNOSIS — R29898 Other symptoms and signs involving the musculoskeletal system: Secondary | ICD-10-CM

## 2020-11-01 DIAGNOSIS — R2689 Other abnormalities of gait and mobility: Secondary | ICD-10-CM

## 2020-11-01 DIAGNOSIS — M6281 Muscle weakness (generalized): Secondary | ICD-10-CM

## 2020-11-01 DIAGNOSIS — G8929 Other chronic pain: Secondary | ICD-10-CM

## 2020-11-01 NOTE — Therapy (Signed)
[facility] [address] [place], [place], [address] Phone: [phone]   Fax:  [phone]  Physical Therapy Evaluation  Patient Details  Name: Jonathon Zimmerman MRN: [id number] Date of Birth: Dec 22, 1955 Referring Provider (PT): Dr Julio SicksHenry Pool   Encounter Date: 11/01/2020   PT End of Session - 11/01/20 1209    Visit Number 1    Number of Visits 12    Date for PT Re-Evaluation 12/13/20    PT Start Time 0930    PT Stop Time 1017    PT Time Calculation (min) 47 min    Activity Tolerance Patient tolerated treatment well           Past Medical History:  Diagnosis Date  . Cervical disc disorder   . Dysphagia   . GERD (gastroesophageal reflux disease)   . Hypertension   . IDA (iron deficiency anemia)   . Spinal stenosis     Past Surgical History:  Procedure Laterality Date  . EYE SURGERY Bilateral 2004   Lasix  . JOINT REPLACEMENT    . neck surgery    . REPLACEMENT TOTAL KNEE Right   . VASECTOMY  1984    There were no vitals filed for this visit.    Subjective Assessment - 11/01/20 0931    Subjective Patient reports that he has had LBP for 20 years with increased symptoms in the past few years. Symptoms gradually progressed. Underwent PLIF lumbar spine L4/5 and L5/S1 on 06/19/20.    Pertinent History cervical disc fusion 2014 and 2018; arthritis; HTN; Rt TKA 2019    Patient Stated Goals decrease stiffness in back; get function to do normal things to decrease stiffness    Currently in Pain? Yes    Pain Score 5     Pain Location Back    Pain Orientation Right;Left;Lower    Pain Descriptors / Indicators Nagging    Pain Type Chronic pain    Pain Radiating Towards Lt LE posterior thigh to foot    Pain Onset More than a month ago    Pain Frequency Constant    Aggravating Factors  living life    Pain Relieving Factors meds; heat              [facility] PT Assessment - 11/01/20 0001      Assessment    Medical Diagnosis Spondylolisthesis lumbar spine post interbody fusion L4/5; L5/S1    Referring Provider (PT) Dr Julio SicksHenry Pool    Onset Date/Surgical Date 06/19/20   LBP x 20 years   Hand Dominance Right    Next MD Visit 8/22    Prior Therapy PT for Rt TKA none for LB or neck      Precautions   Precautions None      Restrictions   Weight Bearing Restrictions No      Balance Screen   Has the patient fallen in the past 6 months No    Has the patient had a decrease in activity level because of a fear of falling?  No    Is the patient reluctant to leave their home because of a fear of falling?  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    Leisure gym 4 days/wk bike; [employer]; outdoor biking before lumbar surgery; yard work; light household chores      Observation/Other Assessments   Focus on  Therapeutic Outcomes (FOTO)  40      Sensation   Additional Comments burning in both hands and arms as well as legs      Posture/Postural Control   Posture Comments head forward; shoulders rounded; decreased lumbar lordosis; wt shifted to Lt; Rt knee slightly flexed      AROM   Right/Left Hip --   tight end ranged Lt > Rt all planes   Lumbar Flexion 60% pain in the LB    Lumbar Extension 25% pain in LB    Lumbar - Right Side Bend 75% sore and painful    Lumbar - Left Side Bend 65% painful in LB    Lumbar - Right Rotation 50% pain LB    Lumbar - Left Rotation 50% pain in the LB and Lt side      Strength   Right Hip Flexion 4/5    Right Hip Extension 3+/5    Right Hip ABduction 4-/5    Left Hip Flexion 3+/5    Left Hip Extension 3-/5    Left Hip ABduction 3/5    Right Knee Flexion 4+/5    Right Knee Extension 4+/5    Left Knee Flexion 4-/5    Left Knee Extension 4-/5    Right/Left Ankle --   heel raise Rt x 10; Lt x 3 with UE support     Flexibility   Hamstrings tight Rt 60 deg; Lt 55 deg     Quadriceps tight Rt 90 deg; Lt 100 deg    ITB tight bilat    Piriformis tight bilat      Palpation   Spinal mobility fusion L4/5; L5/S1    Palpation comment muscular tightness through the lumbar paraspinals; QL; lats; posterior hips bilat      Special Tests   Other special tests (-) SLR; FABERS      Transfers   Comments difficulty with transitional movement; transfers      Ambulation/Gait   Ambulation/Gait Yes    Ambulation/Gait Assistance 7: Independent    Ambulation Distance (Feet) 80 Feet    Assistive device None    Gait Pattern Step-through pattern;Decreased arm swing - right;Decreased arm swing - left;Decreased step length - left;Decreased stance time - left;Decreased stride length;Decreased weight shift to left;Antalgic;Trunk flexed    Ambulation Surface Level      Balance   Balance Assessed --   SLS ~ 10 sec with UE support unsteady                     Objective measurements completed on examination: See above findings.       [facility] Adult PT Treatment/Exercise - 11/01/20 0001      Self-Care   Self-Care Other Self-Care Comments    Other Self-Care Comments  initiated spine care education - provded handout on back care and body mechanics      Lumbar Exercises: Stretches   Passive Hamstring Stretch Right;Left;2 reps;30 seconds   supine with strap   Quad Stretch Right;Left;2 reps;30 seconds   prone with strap   Piriformis Stretch Right;Left;2 reps;30 seconds   supine with strap travell                 PT Education - 11/01/20 1209    Education Details HEP POC posture and body mechanics education handout    Person(s) Educated Patient    Methods Explanation;Demonstration;Tactile cues;Verbal cues;Handout    Comprehension Verbalized understanding;Verbal cues required;Returned demonstration;Tactile cues required  PT Long Term Goals - 11/01/20 1215      PT LONG TERM GOAL #1   Title Decrease pain by 25% allowing patient to  participate in exercise and functional activites with greater ease    Time 6    Period Weeks    Status New    Target Date 12/13/20      PT LONG TERM GOAL #2   Title Increase LE strength by 1/2 to 1 muscle grade    Time 6    Period Weeks    Status New    Target Date 12/13/20      PT LONG TERM GOAL #3   Title Improve posture and algnment in standing therefore improve gait pattern    Time 6    Period Weeks    Status New    Target Date 12/13/20      PT LONG TERM GOAL #4   Title Independent in HEP including gym program    Time 6    Period Weeks    Status New    Target Date 12/13/20      PT LONG TERM GOAL #5   Title Improve functional limitation score to 55    Time 6    Period Weeks    Status New    Target Date 12/13/20                  Plan - 11/01/20 1011    Clinical Impression Statement Patient presents s/p PLIF L4/5; L5/S1 on 06/19/20 following history of LBP for many years. He has a history of two cervical fusion surgeries and persistent radicular tingling and numbness which has not been reduced with surgeries. Patient has LBP; Lt > Rt LE weakness; muscular tightness with palpation; limited functional mobility/ROM; poor movement patterns and body mechanics. Patient will benefit from PT to address problems indentified.    Stability/Clinical Decision Making Stable/Uncomplicated    Clinical Decision Making Low    Rehab Potential Good    PT Frequency 2x / week    PT Duration 6 weeks    PT Treatment/Interventions ADLs/Self Care Home Management;Aquatic Therapy;Cryotherapy;Electrical Stimulation;Iontophoresis 4mg /ml Dexamethasone;Moist Heat;Ultrasound;Gait training;Stair training;Functional mobility training;Therapeutic activities;Therapeutic exercise;Balance training;Neuromuscular re-education;Patient/family education;Manual techniques;Dry needling;Taping    PT Next Visit Plan review HEP; Progress with stretching and core stabilization/strengthening; transitional  movements; transfers; gait training; neuromuscular re-ed working on standing posture and alignment; modalities and manual work as indicated    PT Home Exercise Plan    Consulted and Agree with Plan of Care Patient           Patient will benefit from skilled therapeutic intervention in order to improve the following deficits and impairments:  Abnormal gait,Decreased range of motion,Increased fascial restricitons,Increased muscle spasms,Decreased activity tolerance,Pain,Decreased balance,Impaired flexibility,Improper body mechanics,Decreased mobility,Decreased strength,Postural dysfunction  Visit Diagnosis: Chronic bilateral low back pain, unspecified whether sciatica present  Muscle weakness (generalized)  Other symptoms and signs involving the musculoskeletal system  Other abnormalities of gait and mobility     Problem List Patient Active Problem List   Diagnosis Date Noted  . Degenerative spondylolisthesis 06/19/2020  . Iron deficiency anemia 01/14/2020  . Preop cardiovascular exam 01/14/2020  . Dysphagia 01/14/2020  . Cervical radiculopathy 12/08/2019  . Essential hypertension 12/08/2019  . Gastroesophageal reflux disease without esophagitis 12/08/2019    Jonathon Zimmerman 12/10/2019 PT, MPH  11/01/2020, 12:20 PM  [facility] [address] [place], [place], [place] Phone: [phone]   Fax:  [phone]  Name:  Jonathon Zimmerman MRN: [id number] Date of Birth: 02/01/1956

## 2020-11-01 NOTE — Patient Instructions (Signed)
.  Access Code: [email or web address]: [email or web address]: 03/16/2022Prepared by: Starlynn Klinkner HoltExercises  Prone Quadriceps Stretch with Strap - 2 x daily - 7 x weekly - 1 sets - 3 reps - 30 sec hold  Hooklying Hamstring Stretch with Strap - 2 x daily - 7 x weekly - 1 sets - 3 reps - 30 sec hold  Supine Piriformis Stretch with Leg Straight - 2 x daily - 7 x weekly - 1 sets - 3 reps - 30 sec hold Patient Education  Hospital doctor

## 2020-11-03 ENCOUNTER — Encounter: Payer: Self-pay | Admitting: Physical Therapy

## 2020-11-03 ENCOUNTER — Other Ambulatory Visit: Payer: Self-pay

## 2020-11-03 ENCOUNTER — Ambulatory Visit (INDEPENDENT_AMBULATORY_CARE_PROVIDER_SITE_OTHER): Payer: No Typology Code available for payment source | Admitting: Physical Therapy

## 2020-11-03 DIAGNOSIS — G8929 Other chronic pain: Secondary | ICD-10-CM

## 2020-11-03 DIAGNOSIS — R2689 Other abnormalities of gait and mobility: Secondary | ICD-10-CM | POA: Diagnosis not present

## 2020-11-03 DIAGNOSIS — M6281 Muscle weakness (generalized): Secondary | ICD-10-CM

## 2020-11-03 DIAGNOSIS — R29898 Other symptoms and signs involving the musculoskeletal system: Secondary | ICD-10-CM

## 2020-11-03 DIAGNOSIS — M545 Low back pain, unspecified: Secondary | ICD-10-CM | POA: Diagnosis not present

## 2020-11-03 NOTE — Therapy (Signed)
[facility] [place] [address] [place], [place], [address] Phone: [phone]   Fax:  [phone]  Physical Therapy Treatment  Patient Details  Name: Jonathon Zimmerman MRN: [id number] Date of Birth: Feb 06, 1956 Referring Provider (PT): Dr Julio Sicks   Encounter Date: 11/03/2020   PT End of Session - 11/03/20 1104    Visit Number 2    Number of Visits 12    Date for PT Re-Evaluation 12/13/20    PT Start Time 1105    PT Stop Time 1145    PT Time Calculation (min) 40 min    Activity Tolerance Patient tolerated treatment well    Behavior During Therapy [facility] for tasks assessed/performed           Past Medical History:  Diagnosis Date  . Cervical disc disorder   . Dysphagia   . GERD (gastroesophageal reflux disease)   . Hypertension   . IDA (iron deficiency anemia)   . Spinal stenosis     Past Surgical History:  Procedure Laterality Date  . EYE SURGERY Bilateral 2004   Lasix  . JOINT REPLACEMENT    . neck surgery    . REPLACEMENT TOTAL KNEE Right   . VASECTOMY  1984    There were no vitals filed for this visit.   Subjective Assessment - 11/03/20 1109    Subjective Pt complains of stiffness in back and weakness in LLE.    Patient Stated Goals decrease stiffness in back; get function to do normal things to decrease stiffness    Currently in Pain? Yes    Pain Score 5     Pain Location Back    Pain Orientation Lower    Pain Descriptors / Indicators Sore;Tightness    Aggravating Factors  prolonged walking    Pain Relieving Factors medication              [facility] PT Assessment - 11/03/20 0001      Assessment   Medical Diagnosis Spondylolisthesis lumbar spine post interbody fusion L4/5; L5/S1    Referring Provider (PT) Dr Julio Sicks    Onset Date/Surgical Date 06/19/20   LBP x 20 years   Hand Dominance Right    Next MD Visit 8/22    Prior Therapy PT for Rt TKA none for LB or neck            [facility] Adult  PT Treatment/Exercise - 11/03/20 0001      Lumbar Exercises: Stretches   Passive Hamstring Stretch Right;Left;2 reps;20 seconds   seated   Passive Hamstring Stretch Limitations reviewed supine verbally    Quad Stretch Left;Right;3 reps;20 seconds   seated and prone   Piriformis Stretch Right;Left;2 reps;20 seconds   supine, knee towards opp shoulder   Other Lumbar Stretch Exercise midlevel doorway stretch x 15 sec x 2 reps (for more upright posture)      Lumbar Exercises: Aerobic   Recumbent Bike L1: 4 min for warm up      Lumbar Exercises: Seated   Sit to Stand 10 reps   from elevated table, arms in front     Lumbar Exercises: Supine   Bridge 5 reps   core engaged            PT Long Term Goals - 11/01/20 1215      PT LONG TERM GOAL #1   Title Decrease pain by 25% allowing patient to participate in exercise and functional activites with greater ease    Time  6    Period Weeks    Status New    Target Date 12/13/20      PT LONG TERM GOAL #2   Title Increase LE strength by 1/2 to 1 muscle grade    Time 6    Period Weeks    Status New    Target Date 12/13/20      PT LONG TERM GOAL #3   Title Improve posture and algnment in standing therefore improve gait pattern    Time 6    Period Weeks    Status New    Target Date 12/13/20      PT LONG TERM GOAL #4   Title Independent in HEP including gym program    Time 6    Period Weeks    Status New    Target Date 12/13/20      PT LONG TERM GOAL #5   Title Improve functional limitation score to 55    Time 6    Period Weeks    Status New    Target Date 12/13/20                 Plan - 11/03/20 1131    Clinical Impression Statement Pt tolerated exercises well, without increase in pain, except for bridge.  Rt thigh tighter than LLE (history of Rt TKR in 2019). Initiated education on Teacher, early years/pre and core engagement.  Goals are ongoing.    Stability/Clinical Decision Making Stable/Uncomplicated    Rehab  Potential Good    PT Frequency 2x / week    PT Duration 6 weeks    PT Treatment/Interventions ADLs/Self Care Home Management;Aquatic Therapy;Cryotherapy;Electrical Stimulation;Iontophoresis 4mg /ml Dexamethasone;Moist Heat;Ultrasound;Gait training;Stair training;Functional mobility training;Therapeutic activities;Therapeutic exercise;Balance training;Neuromuscular re-education;Patient/family education;Manual techniques;Dry needling;Taping    PT Next Visit Plan continue with core stabilization, LE stretches and postural strengthening.  Progress HEP as tolerated. Pt to bring in TENS unit for further instruction/review parameters.    PT Home Exercise Plan    Consulted and Agree with Plan of Care Patient           Patient will benefit from skilled therapeutic intervention in order to improve the following deficits and impairments:  Abnormal gait,Decreased range of motion,Increased fascial restricitons,Increased muscle spasms,Decreased activity tolerance,Pain,Decreased balance,Impaired flexibility,Improper body mechanics,Decreased mobility,Decreased strength,Postural dysfunction  Visit Diagnosis: Chronic bilateral low back pain, unspecified whether sciatica present  Muscle weakness (generalized)  Other symptoms and signs involving the musculoskeletal system  Other abnormalities of gait and mobility     Problem List Patient Active Problem List   Diagnosis Date Noted  . Degenerative spondylolisthesis 06/19/2020  . Iron deficiency anemia 01/14/2020  . Preop cardiovascular exam 01/14/2020  . Dysphagia 01/14/2020  . Cervical radiculopathy 12/08/2019  . Essential hypertension 12/08/2019  . Gastroesophageal reflux disease without esophagitis 12/08/2019   12/10/2019, PTA 11/03/20 11:49 AM  [facility] [place] [address] [place], [place], [place] Phone: [phone]   Fax:  [phone]  Name: Jonathon Zimmerman MRN: [name] Jack Date of Birth: 1955-09-19

## 2020-11-03 NOTE — Patient Instructions (Signed)
Access Code: [email or web address] URL: [email or web address] Date: 11/03/2020 Prepared by: [facility]  Exercises Prone Quadriceps Stretch with Strap - 2 x daily - 7 x weekly - 1 sets - 3 reps - 30 sec hold Hooklying Hamstring Stretch with Strap - 2 x daily - 7 x weekly - 1 sets - 3 reps - 30 sec hold Supine Piriformis Stretch with Leg Straight - 2 x daily - 7 x weekly - 1 sets - 3 reps - 30 sec hold Supine Piriformis Stretch with Foot on Ground - 2 x daily - 7 x weekly - 1 sets - 2-3 reps - 20 hold Sit to Stand Without Arm Support - 2 x daily - 7 x weekly - 1 sets - 5-10 reps

## 2020-11-07 ENCOUNTER — Other Ambulatory Visit: Payer: Self-pay

## 2020-11-07 ENCOUNTER — Ambulatory Visit (INDEPENDENT_AMBULATORY_CARE_PROVIDER_SITE_OTHER): Payer: No Typology Code available for payment source | Admitting: Physical Therapy

## 2020-11-07 DIAGNOSIS — M6281 Muscle weakness (generalized): Secondary | ICD-10-CM | POA: Diagnosis not present

## 2020-11-07 DIAGNOSIS — R29898 Other symptoms and signs involving the musculoskeletal system: Secondary | ICD-10-CM

## 2020-11-07 DIAGNOSIS — M545 Low back pain, unspecified: Secondary | ICD-10-CM | POA: Diagnosis not present

## 2020-11-07 DIAGNOSIS — R2689 Other abnormalities of gait and mobility: Secondary | ICD-10-CM | POA: Diagnosis not present

## 2020-11-07 DIAGNOSIS — G8929 Other chronic pain: Secondary | ICD-10-CM

## 2020-11-07 NOTE — Therapy (Signed)
[facility] [place] [address] [place], [place], [address] Phone: [phone]   Fax:  [phone]  Physical Therapy Treatment  Patient Details  Name: Jonathon Zimmerman MRN: [id number] Date of Birth: 11/22/1955 Referring Provider (PT): Dr Julio Sicks   Encounter Date: 11/07/2020   PT End of Session - 11/07/20 1149    Visit Number 3    Number of Visits 12    Date for PT Re-Evaluation 12/13/20    PT Start Time 1102    PT Stop Time 1147    PT Time Calculation (min) 45 min    Activity Tolerance Patient tolerated treatment well    Behavior During Therapy [facility] for tasks assessed/performed           Past Medical History:  Diagnosis Date  . Cervical disc disorder   . Dysphagia   . GERD (gastroesophageal reflux disease)   . Hypertension   . IDA (iron deficiency anemia)   . Spinal stenosis     Past Surgical History:  Procedure Laterality Date  . EYE SURGERY Bilateral 2004   Lasix  . JOINT REPLACEMENT    . neck surgery    . REPLACEMENT TOTAL KNEE Right   . VASECTOMY  1984    There were no vitals filed for this visit.   Subjective Assessment - 11/07/20 1109    Subjective Pt reports he found his TENS and used it yesterday at home.  He has been doing his new exercises.  "I noticed I'm still dragging my left foot".    Pertinent History cervical disc fusion 2014 and 2018; arthritis; HTN; Rt TKA 2019    Patient Stated Goals decrease stiffness in back; get function to do normal things to decrease stiffness    Currently in Pain? Yes    Pain Score 4     Pain Location Back    Pain Orientation Lower    Pain Descriptors / Indicators Sore    Aggravating Factors  prolonged walking    Pain Relieving Factors medication.              [facility] PT Assessment - 11/07/20 0001      Assessment   Medical Diagnosis Spondylolisthesis lumbar spine post interbody fusion L4/5; L5/S1    Referring Provider (PT) Dr Julio Sicks    Onset  Date/Surgical Date 06/19/20   LBP x 20 years   Hand Dominance Right    Prior Therapy PT for Rt TKA none for LB or neck            [facility] Adult PT Treatment/Exercise - 11/07/20 0001      Self-Care   Other Self-Care Comments  educated pt on TENS application, safety and parameters.  Pt verbalized understanding.      Lumbar Exercises: Stretches   Passive Hamstring Stretch Right;Left;2 reps;30 seconds    Passive Hamstring Stretch Limitations foot on 12" step x 20 sec x 2 reps each    Quad Stretch Right;Left;2 reps;30 seconds      Lumbar Exercises: Aerobic   Nustep L5: arms/legs x 5 min      Lumbar Exercises: Standing   Heel Raises 10 reps   heels off edge of step with eccentric lowering   Other Standing Lumbar Exercises SLS with toe taps to 6" step without UE support, slow and controlled x 10 each.  forward step up with L/R leg x 2 reps (good control).    Other Standing Lumbar Exercises side stepping over 2 yoga stacked blocks (  5.5") x 10 with LLE, 5 with RLE, light UE support on counter.      Lumbar Exercises: Seated   Sit to Stand 5 reps   arms forward.     Shoulder Exercises: Stretch   Other Shoulder Stretches mid level bilat doorway stretch x 15 sec x 3 reps, unilateral high doorway stretch x 2 reps of 15 sec                       PT Long Term Goals - 11/01/20 1215      PT LONG TERM GOAL #1   Title Decrease pain by 25% allowing patient to participate in exercise and functional activites with greater ease    Time 6    Period Weeks    Status New    Target Date 12/13/20      PT LONG TERM GOAL #2   Title Increase LE strength by 1/2 to 1 muscle grade    Time 6    Period Weeks    Status New    Target Date 12/13/20      PT LONG TERM GOAL #3   Title Improve posture and algnment in standing therefore improve gait pattern    Time 6    Period Weeks    Status New    Target Date 12/13/20      PT LONG TERM GOAL #4   Title Independent in HEP including gym program     Time 6    Period Weeks    Status New    Target Date 12/13/20      PT LONG TERM GOAL #5   Title Improve functional limitation score to 55    Time 6    Period Weeks    Status New    Target Date 12/13/20                 Plan - 11/07/20 1132    Clinical Impression Statement Pt demonstrated improved eccentric control and posture with sit to/from stand today. L posterior LE tighter than Rt with stretches, Rt quad tighter than Lt.  Time spent working on LE balance, flexibility and functional hip flexion exercises.  Occasional cues to engage core with hip flexion. All exercises tolerated without increase in pain.   Making gains towards LTGs.    Stability/Clinical Decision Making Stable/Uncomplicated    Rehab Potential Good    PT Frequency 2x / week    PT Duration 6 weeks    PT Treatment/Interventions ADLs/Self Care Home Management;Aquatic Therapy;Cryotherapy;Electrical Stimulation;Iontophoresis 4mg /ml Dexamethasone;Moist Heat;Ultrasound;Gait training;Stair training;Functional mobility training;Therapeutic activities;Therapeutic exercise;Balance training;Neuromuscular re-education;Patient/family education;Manual techniques;Dry needling;Taping    PT Next Visit Plan continue with core stabilization, LE stretches and postural strengthening.  Progress HEP as tolerated.    PT Home Exercise Plan    Consulted and Agree with Plan of Care Patient           Patient will benefit from skilled therapeutic intervention in order to improve the following deficits and impairments:  Abnormal gait,Decreased range of motion,Increased fascial restricitons,Increased muscle spasms,Decreased activity tolerance,Pain,Decreased balance,Impaired flexibility,Improper body mechanics,Decreased mobility,Decreased strength,Postural dysfunction  Visit Diagnosis: Chronic bilateral low back pain, unspecified whether sciatica present  Muscle weakness (generalized)  Other symptoms and signs involving the  musculoskeletal system  Other abnormalities of gait and mobility     Problem List Patient Active Problem List   Diagnosis Date Noted  . Degenerative spondylolisthesis 06/19/2020  . Iron deficiency anemia 01/14/2020  . Preop cardiovascular exam  01/14/2020  . Dysphagia 01/14/2020  . Cervical radiculopathy 12/08/2019  . Essential hypertension 12/08/2019  . Gastroesophageal reflux disease without esophagitis 12/08/2019   Mayer Camel, PTA 11/07/20 11:52 AM  [facility] [place] [address] [place], [place], [address] Phone: [phone]   Fax:  [phone]  Name: Jonathon Zimmerman MRN: [id number] Date of Birth: 1956/08/18

## 2020-11-09 ENCOUNTER — Ambulatory Visit (INDEPENDENT_AMBULATORY_CARE_PROVIDER_SITE_OTHER): Payer: No Typology Code available for payment source | Admitting: Physical Therapy

## 2020-11-09 ENCOUNTER — Other Ambulatory Visit: Payer: Self-pay

## 2020-11-09 DIAGNOSIS — R29898 Other symptoms and signs involving the musculoskeletal system: Secondary | ICD-10-CM | POA: Diagnosis not present

## 2020-11-09 DIAGNOSIS — M545 Low back pain, unspecified: Secondary | ICD-10-CM

## 2020-11-09 DIAGNOSIS — M6281 Muscle weakness (generalized): Secondary | ICD-10-CM

## 2020-11-09 DIAGNOSIS — G8929 Other chronic pain: Secondary | ICD-10-CM

## 2020-11-09 NOTE — Patient Instructions (Addendum)
  Access Code: [email or web address] URL: [email or web address] Date: 11/09/2020 Prepared by: [facility]  Exercises Prone Quadriceps Stretch with Strap - 2 x daily - 7 x weekly - 1 sets - 3 reps - 30 sec hold Hooklying Hamstring Stretch with Strap - 2 x daily - 7 x weekly - 1 sets - 3 reps - 30 sec hold Supine Piriformis Stretch with Leg Straight - 2 x daily - 7 x weekly - 1 sets - 3 reps - 30 sec hold Supine Piriformis Stretch with Foot on Ground - 2 x daily - 7 x weekly - 1 sets - 2-3 reps - 20 hold Sit to Stand Without Arm Support - 2 x daily - 7 x weekly - 1 sets - 5-10 reps Doorway Pec Stretch at 90 Degrees Abduction - 2 x daily - 7 x weekly - 1 sets - 2 reps - 15 sec hold Sidelying Thoracic Rotation with Open Book - 1 x daily - 7 x weekly - 1 sets - 5 reps - 5 seconds hold

## 2020-11-09 NOTE — Therapy (Signed)
[facility] [place] [address] [place], [place], [address] Phone: [phone]   Fax:  [phone]  Physical Therapy Treatment  Patient Details  Name: Jonathon Zimmerman MRN: [id number] Date of Birth: [birth year] Referring Provider (PT): Dr Julio Sicks   Encounter Date: 11/09/2020   PT End of Session - 11/09/20 1402    Visit Number 4    Number of Visits 12    Date for PT Re-Evaluation 12/13/20    PT Start Time 1402    PT Stop Time 1450   MHP last 10 min   PT Time Calculation (min) 48 min    Activity Tolerance Patient tolerated treatment well    Behavior During Therapy [facility] for tasks assessed/performed           Past Medical History:  Diagnosis Date  . Cervical disc disorder   . Dysphagia   . GERD (gastroesophageal reflux disease)   . Hypertension   . IDA (iron deficiency anemia)   . Spinal stenosis     Past Surgical History:  Procedure Laterality Date  . EYE SURGERY Bilateral 2004   Lasix  . JOINT REPLACEMENT    . neck surgery    . REPLACEMENT TOTAL KNEE Right   . VASECTOMY  1984    There were no vitals filed for this visit.   Subjective Assessment - 11/09/20 1406    Subjective "My back is sore and stiff".  Pt reports he may have overdone it last session; his Lt calf was hurting.  Pain didn't resolve until next morning.    Patient Stated Goals decrease stiffness in back; get function to do normal things to decrease stiffness    Currently in Pain? Yes    Pain Score 7     Pain Location Back    Pain Orientation Lower    Pain Descriptors / Indicators Sore              [facility] PT Assessment - 11/09/20 0001      Assessment   Medical Diagnosis Spondylolisthesis lumbar spine post interbody fusion L4/5; L5/S1    Referring Provider (PT) Dr Julio Sicks    Onset Date/Surgical Date 06/19/20   LBP x 20 years   Hand Dominance Right    Prior Therapy PT for Rt TKA none for LB or neck            [facility] Adult PT  Treatment/Exercise - 11/09/20 0001      Lumbar Exercises: Stretches   Lower Trunk Rotation 3 reps;10 seconds   arms in T   Hip Flexor Stretch Right;Left;2 reps;20 seconds   seated, with leg back.   Gastroc Stretch Right;Left;2 reps;20 seconds   runners stretch   Other Lumbar Stretch Exercise seated side stretch with arm reaching to ceiling x 2 reps each side. Open book x 5 reps each side.      Lumbar Exercises: Aerobic   Recumbent Bike L2: 3 min for warm up.      Lumbar Exercises: Seated   Sit to Stand 10 reps   arms forward, cues for form/ hip hinge/ core engaged.     Lumbar Exercises: Supine   Clam 5 reps   each leg with ab set.   Bent Knee Raise 10 reps   with ab set   Bridge 10 reps      Shoulder Exercises: Stretch   Other Shoulder Stretches overhead stretch, sliding arms up cabinets x 5 reps (for thoracic/lumbar ext)  PT Education - 11/09/20 1442    Education Details HEP updated    Person(s) Educated Patient    Methods Explanation;Handout;Verbal cues;Demonstration    Comprehension Verbalized understanding;Returned demonstration               PT Long Term Goals - 11/01/20 1215      PT LONG TERM GOAL #1   Title Decrease pain by 25% allowing patient to participate in exercise and functional activites with greater ease    Time 6    Period Weeks    Status New    Target Date 12/13/20      PT LONG TERM GOAL #2   Title Increase LE strength by 1/2 to 1 muscle grade    Time 6    Period Weeks    Status New    Target Date 12/13/20      PT LONG TERM GOAL #3   Title Improve posture and algnment in standing therefore improve gait pattern    Time 6    Period Weeks    Status New    Target Date 12/13/20      PT LONG TERM GOAL #4   Title Independent in HEP including gym program    Time 6    Period Weeks    Status New    Target Date 12/13/20      PT LONG TERM GOAL #5   Title Improve functional limitation score to 55    Time 6    Period Weeks     Status New    Target Date 12/13/20                 Plan - 11/09/20 1443    Clinical Impression Statement Trialed new flexibility exercises; tolerated well without increase in pain.  Will gradually progress strengthening and core exercises to tolerance.  Progressing toward LTGs.    Stability/Clinical Decision Making Stable/Uncomplicated    Rehab Potential Good    PT Frequency 2x / week    PT Duration 6 weeks    PT Treatment/Interventions ADLs/Self Care Home Management;Aquatic Therapy;Cryotherapy;Electrical Stimulation;Iontophoresis 4mg /ml Dexamethasone;Moist Heat;Ultrasound;Gait training;Stair training;Functional mobility training;Therapeutic activities;Therapeutic exercise;Balance training;Neuromuscular re-education;Patient/family education;Manual techniques;Dry needling;Taping    PT Next Visit Plan continue with core stabilization, LE stretches and postural strengthening.  Progress HEP as tolerated.    PT Home Exercise Plan    Consulted and Agree with Plan of Care Patient           Patient will benefit from skilled therapeutic intervention in order to improve the following deficits and impairments:  Abnormal gait,Decreased range of motion,Increased fascial restricitons,Increased muscle spasms,Decreased activity tolerance,Pain,Decreased balance,Impaired flexibility,Improper body mechanics,Decreased mobility,Decreased strength,Postural dysfunction  Visit Diagnosis: Chronic bilateral low back pain, unspecified whether sciatica present  Muscle weakness (generalized)  Other symptoms and signs involving the musculoskeletal system     Problem List Patient Active Problem List   Diagnosis Date Noted  . Degenerative spondylolisthesis 06/19/2020  . Iron deficiency anemia 01/14/2020  . Preop cardiovascular exam 01/14/2020  . Dysphagia 01/14/2020  . Cervical radiculopathy 12/08/2019  . Essential hypertension 12/08/2019  . Gastroesophageal reflux disease without  esophagitis 12/08/2019   12/10/2019, PTA 11/09/20 2:48 PM  [facility] [place] [address] [place], [place], [place] Phone: [phone]   Fax:  [phone]  Name: Jonathon Zimmerman MRN: [name] Jack Date of Birth: December 13, 1955

## 2020-11-13 ENCOUNTER — Ambulatory Visit (INDEPENDENT_AMBULATORY_CARE_PROVIDER_SITE_OTHER): Payer: No Typology Code available for payment source | Admitting: Rehabilitative and Restorative Service Providers"

## 2020-11-13 ENCOUNTER — Encounter: Payer: Self-pay | Admitting: Rehabilitative and Restorative Service Providers"

## 2020-11-13 ENCOUNTER — Other Ambulatory Visit: Payer: Self-pay

## 2020-11-13 DIAGNOSIS — R2689 Other abnormalities of gait and mobility: Secondary | ICD-10-CM | POA: Diagnosis not present

## 2020-11-13 DIAGNOSIS — R29898 Other symptoms and signs involving the musculoskeletal system: Secondary | ICD-10-CM

## 2020-11-13 DIAGNOSIS — M545 Low back pain, unspecified: Secondary | ICD-10-CM | POA: Diagnosis not present

## 2020-11-13 DIAGNOSIS — G8929 Other chronic pain: Secondary | ICD-10-CM

## 2020-11-13 DIAGNOSIS — M6281 Muscle weakness (generalized): Secondary | ICD-10-CM | POA: Diagnosis not present

## 2020-11-13 NOTE — Patient Instructions (Signed)
Access Code: [email or web address]: [email or web address]: 03/28/2022Prepared by: Valgene Deloatch HoltExercises  Prone Quadriceps Stretch with Strap - 2 x daily - 7 x weekly - 1 sets - 3 reps - 30 sec hold  Hooklying Hamstring Stretch with Strap - 2 x daily - 7 x weekly - 1 sets - 3 reps - 30 sec hold  Supine Piriformis Stretch with Leg Straight - 2 x daily - 7 x weekly - 1 sets - 3 reps - 30 sec hold  Supine Piriformis Stretch with Foot on Ground - 2 x daily - 7 x weekly - 1 sets - 2-3 reps - 20 hold  Sit to Stand Without Arm Support - 2 x daily - 7 x weekly - 1 sets - 5-10 reps  Doorway Pec Stretch at 90 Degrees Abduction - 2 x daily - 7 x weekly - 1 sets - 2 reps - 15 sec hold  Sidelying Thoracic Rotation with Open Book - 1 x daily - 7 x weekly - 1 sets - 5 reps - 5 seconds hold  Standing Bilateral Low Shoulder Row with Anchored Resistance - 2 x daily - 7 x weekly - 1-3 sets - 10 reps - 2-3 sec hold  Shoulder Extension with Resistance - 2 x daily - 7 x weekly - 1-3 sets - 10 reps - 2-3 sec hold  Anti-Rotation Lateral Stepping with Press - 2 x daily - 7 x weekly - 1-2 sets - 10 reps - 2-3 sec hold

## 2020-11-13 NOTE — Therapy (Signed)
[facility] [place] [address] [place], [place], [address] Phone: [phone]   Fax:  [phone]  Physical Therapy Treatment  Patient Details  Name: Jonathon Zimmerman MRN: [id number] Date of Birth: 03/11/1956 Referring Provider (PT): Dr Julio Sicks   Encounter Date: 11/13/2020   PT End of Session - 11/13/20 1059    Visit Number 5    Number of Visits 12    Date for PT Re-Evaluation 12/13/20    PT Start Time 1059    PT Stop Time 1148    PT Time Calculation (min) 49 min    Activity Tolerance Patient tolerated treatment well           Past Medical History:  Diagnosis Date  . Cervical disc disorder   . Dysphagia   . GERD (gastroesophageal reflux disease)   . Hypertension   . IDA (iron deficiency anemia)   . Spinal stenosis     Past Surgical History:  Procedure Laterality Date  . EYE SURGERY Bilateral 2004   Lasix  . JOINT REPLACEMENT    . neck surgery    . REPLACEMENT TOTAL KNEE Right   . VASECTOMY  1984    There were no vitals filed for this visit.   Subjective Assessment - 11/13/20 1100    Subjective Patient reports that his back is a little worse with the colder weather yesterday. He has been doing "a little bit" with his exercises.    Currently in Pain? Yes    Pain Score 5     Pain Location Back    Pain Orientation Lower    Pain Descriptors / Indicators Sore;Aching    Pain Type Chronic pain    Pain Radiating Towards Lt LR posterior thigh to foot "nerve problem"    Pain Onset More than a month ago    Pain Frequency Constant    Aggravating Factors  prolonged walking    Pain Relieving Factors medication                             [facility] Adult PT Treatment/Exercise - 11/13/20 0001      Lumbar Exercises: Aerobic   Nustep L5: arms/legs x 6.5 min      Lumbar Exercises: Standing   Lifting From 12";10 reps    Lifting Weights (lbs) 10    Lifting Limitations working on technique with  core tight bending knees    Row Strengthening;Both;10 reps;Theraband    Theraband Level (Row) Level 3 (Green)    Shoulder Extension Strengthening;Both;10 reps;Theraband    Theraband Level (Shoulder Extension) Level 3 (Green)    Shoulder Adduction Limitations antirotation green TB x 10 each side VC to engage core    Other Standing Lumbar Exercises SLS with toe taps to 6" step without UE support, slow and controlled x 10 each.  forward step up with L/R leg x 10 reps    Other Standing Lumbar Exercises hip exension Rt/Lt x 10; abduction leading with heel leading x 10 reps      Lumbar Exercises: Seated   Sit to Stand 10 reps   hands on hips cues for form/ hip hinge/ core engaged     Moist Heat Therapy   Number Minutes Moist Heat 10 Minutes    Moist Heat Location Lumbar Spine                  PT Education - 11/13/20 1127    Education  Details HEP issued green TB    Person(s) Educated Patient    Methods Explanation;Demonstration;Tactile cues;Verbal cues;Handout    Comprehension Verbalized understanding;Returned demonstration;Verbal cues required;Tactile cues required               PT Long Term Goals - 11/01/20 1215      PT LONG TERM GOAL #1   Title Decrease pain by 25% allowing patient to participate in exercise and functional activites with greater ease    Time 6    Period Weeks    Status New    Target Date 12/13/20      PT LONG TERM GOAL #2   Title Increase LE strength by 1/2 to 1 muscle grade    Time 6    Period Weeks    Status New    Target Date 12/13/20      PT LONG TERM GOAL #3   Title Improve posture and algnment in standing therefore improve gait pattern    Time 6    Period Weeks    Status New    Target Date 12/13/20      PT LONG TERM GOAL #4   Title Independent in HEP including gym program    Time 6    Period Weeks    Status New    Target Date 12/13/20      PT LONG TERM GOAL #5   Title Improve functional limitation score to 55    Time 6     Period Weeks    Status New    Target Date 12/13/20                 Plan - 11/13/20 1114    Clinical Impression Statement Patient continues to have pain in LB with variable intensity. Continued with core stabilization and strengthening. Added exercise per flow sheet. Issued green TB for home.    Rehab Potential Good    PT Frequency 2x / week    PT Duration 6 weeks    PT Treatment/Interventions ADLs/Self Care Home Management;Aquatic Therapy;Cryotherapy;Electrical Stimulation;Iontophoresis 4mg /ml Dexamethasone;Moist Heat;Ultrasound;Gait training;Stair training;Functional mobility training;Therapeutic activities;Therapeutic exercise;Balance training;Neuromuscular re-education;Patient/family education;Manual techniques;Dry needling;Taping    PT Next Visit Plan continue with core stabilization, LE stretches and postural strengthening.  Progress HEP as tolerated.    PT Home Exercise Plan    Consulted and Agree with Plan of Care Patient           Patient will benefit from skilled therapeutic intervention in order to improve the following deficits and impairments:     Visit Diagnosis: Chronic bilateral low back pain, unspecified whether sciatica present  Muscle weakness (generalized)  Other symptoms and signs involving the musculoskeletal system  Other abnormalities of gait and mobility     Problem List Patient Active Problem List   Diagnosis Date Noted  . Degenerative spondylolisthesis 06/19/2020  . Iron deficiency anemia 01/14/2020  . Preop cardiovascular exam 01/14/2020  . Dysphagia 01/14/2020  . Cervical radiculopathy 12/08/2019  . Essential hypertension 12/08/2019  . Gastroesophageal reflux disease without esophagitis 12/08/2019    Delayne Sanzo 12/10/2019 PT, MPH  11/13/2020, 11:40 AM  [facility] [address] [place], [place], [place] Phone: [phone]   Fax:  [phone]  Name: Jonathon Zimmerman MRN: [name] Jack Date of Birth: 1955-10-03

## 2020-11-16 ENCOUNTER — Other Ambulatory Visit: Payer: Self-pay

## 2020-11-16 ENCOUNTER — Encounter: Payer: Self-pay | Admitting: Physical Therapy

## 2020-11-16 ENCOUNTER — Ambulatory Visit (INDEPENDENT_AMBULATORY_CARE_PROVIDER_SITE_OTHER): Payer: No Typology Code available for payment source | Admitting: Physical Therapy

## 2020-11-16 DIAGNOSIS — R29898 Other symptoms and signs involving the musculoskeletal system: Secondary | ICD-10-CM

## 2020-11-16 DIAGNOSIS — G8929 Other chronic pain: Secondary | ICD-10-CM | POA: Diagnosis not present

## 2020-11-16 DIAGNOSIS — M545 Low back pain, unspecified: Secondary | ICD-10-CM | POA: Diagnosis not present

## 2020-11-16 DIAGNOSIS — M6281 Muscle weakness (generalized): Secondary | ICD-10-CM | POA: Diagnosis not present

## 2020-11-16 NOTE — Therapy (Signed)
[facility] [place] [address] [place], [place], [address] Phone: [phone]   Fax:  [phone]  Physical Therapy Treatment  Patient Details  Name: Jonathon Zimmerman MRN: [id number] Date of Birth: 09/12/1955 Referring Provider (PT): Dr Julio Sicks   Encounter Date: 11/16/2020   PT End of Session - 11/16/20 1101    Visit Number 6    Number of Visits 12    Date for PT Re-Evaluation 12/13/20    PT Start Time 1101    PT Stop Time 1140    PT Time Calculation (min) 39 min    Activity Tolerance Patient tolerated treatment well    Behavior During Therapy [facility] for tasks assessed/performed           Past Medical History:  Diagnosis Date  . Cervical disc disorder   . Dysphagia   . GERD (gastroesophageal reflux disease)   . Hypertension   . IDA (iron deficiency anemia)   . Spinal stenosis     Past Surgical History:  Procedure Laterality Date  . EYE SURGERY Bilateral 2004   Lasix  . JOINT REPLACEMENT    . neck surgery    . REPLACEMENT TOTAL KNEE Right   . VASECTOMY  1984    There were no vitals filed for this visit.   Subjective Assessment - 11/16/20 1101    Subjective Pt reports he is disappointed with his Rt knee replacement. Overall, complains of stiffness in back that is worse with the foul weather.    Currently in Pain? Yes    Pain Score 5     Pain Location Back    Pain Orientation Right;Left;Lower    Pain Descriptors / Indicators Sore    Pain Onset More than a month ago    Aggravating Factors  prolonged walking    Pain Relieving Factors medication, MHP              [facility] PT Assessment - 11/16/20 0001      Assessment   Medical Diagnosis Spondylolisthesis lumbar spine post interbody fusion L4/5; L5/S1    Referring Provider (PT) Dr Julio Sicks    Onset Date/Surgical Date 06/19/20   LBP x 20 years   Hand Dominance Right    Prior Therapy PT for Rt TKA none for LB or neck      AROM   Overall AROM  Comments Lt knee ROM 2-125 deg, Rt knee 5-122 deg      Flexibility   Quadriceps Rt - 96 deg, Lt - 110 deg            [facility] Adult PT Treatment/Exercise - 11/16/20 0001      Lumbar Exercises: Stretches   Passive Hamstring Stretch Left;Right;2 reps;20 seconds   supine with strap, opp knee flexion   Lower Trunk Rotation 10 seconds;4 reps   arms in T   Hip Flexor Stretch Limitations Trial of seated for RLE - difficulty    Other Lumbar Stretch Exercise midlevel doorway stretch x 30 sec x 2 reps      Lumbar Exercises: Aerobic   Recumbent Bike L2: 5 min for warm up.      Lumbar Exercises: Seated   Sit to Stand 10 reps   holding 5# KB   Sit to Stand Limitations cues for hip hinge, straight back.    Other Seated Lumbar Exercises seated row with green band x 10 reps      Lumbar Exercises: Supine   Heel Slides 5 reps  Bent Knee Raise 5 reps    Dead Bug 5 reps    Bridge 10 reps   cues for core and glute set   Bridge Limitations painful first 2 reps; improved after 3rd rep      Modalities   Modalities --   deferred; will use TENS/ heat at home             PT Long Term Goals - 11/16/20 1254      PT LONG TERM GOAL #1   Title Decrease pain by 25% allowing patient to participate in exercise and functional activites with greater ease    Time 6    Period Weeks    Status On-going      PT LONG TERM GOAL #2   Title Increase LE strength by 1/2 to 1 muscle grade    Time 6    Period Weeks    Status On-going      PT LONG TERM GOAL #3   Title Improve posture and algnment in standing therefore improve gait pattern    Time 6    Period Weeks    Status On-going      PT LONG TERM GOAL #4   Title Independent in HEP including gym program    Time 6    Period Weeks    Status On-going      PT LONG TERM GOAL #5   Title Improve functional limitation score to 55    Time 6    Period Weeks    Status On-going                 Plan - 11/16/20 1137    Clinical Impression  Statement Pt demonstrating improved quad flexiblity.  Pt requires minor cues for form with squat/sit to stand. Does well engaging core during exercises.  He reported increase in LBP during first 2 reps, and last rep of 10 of bridge today. Otherwise, all other exercises tolerated well.  bilat knee ROM similar, RLE slightly less than LLE. Progressing well towards goals.    Rehab Potential Good    PT Frequency 2x / week    PT Duration 6 weeks    PT Treatment/Interventions ADLs/Self Care Home Management;Aquatic Therapy;Cryotherapy;Electrical Stimulation;Iontophoresis 4mg /ml Dexamethasone;Moist Heat;Ultrasound;Gait training;Stair training;Functional mobility training;Therapeutic activities;Therapeutic exercise;Balance training;Neuromuscular re-education;Patient/family education;Manual techniques;Dry needling;Taping    PT Next Visit Plan continue with core stabilization, LE stretches and postural strengthening.  Progress HEP as tolerated.    PT Home Exercise Plan    Consulted and Agree with Plan of Care Patient           Patient will benefit from skilled therapeutic intervention in order to improve the following deficits and impairments:  Abnormal gait,Decreased range of motion,Increased fascial restricitons,Increased muscle spasms,Decreased activity tolerance,Pain,Decreased balance,Impaired flexibility,Improper body mechanics,Decreased mobility,Decreased strength,Postural dysfunction  Visit Diagnosis: Chronic bilateral low back pain, unspecified whether sciatica present  Muscle weakness (generalized)  Other symptoms and signs involving the musculoskeletal system     Problem List Patient Active Problem List   Diagnosis Date Noted  . Degenerative spondylolisthesis 06/19/2020  . Iron deficiency anemia 01/14/2020  . Preop cardiovascular exam 01/14/2020  . Dysphagia 01/14/2020  . Cervical radiculopathy 12/08/2019  . Essential hypertension 12/08/2019  . Gastroesophageal reflux disease  without esophagitis 12/08/2019   12/10/2019, PTA 11/16/20 12:54 PM  [facility] [place] [address] [place], [place], [place] Phone: [phone]   Fax:  [phone]  Name: Taylen Osorto MRN: [name] Jack Date of  Birth: [birth year]

## 2020-11-20 ENCOUNTER — Encounter: Payer: Self-pay | Admitting: Rehabilitative and Restorative Service Providers"

## 2020-11-20 ENCOUNTER — Other Ambulatory Visit: Payer: Self-pay

## 2020-11-20 ENCOUNTER — Ambulatory Visit (INDEPENDENT_AMBULATORY_CARE_PROVIDER_SITE_OTHER): Payer: No Typology Code available for payment source | Admitting: Rehabilitative and Restorative Service Providers"

## 2020-11-20 DIAGNOSIS — M545 Low back pain, unspecified: Secondary | ICD-10-CM | POA: Diagnosis not present

## 2020-11-20 DIAGNOSIS — R2689 Other abnormalities of gait and mobility: Secondary | ICD-10-CM | POA: Diagnosis not present

## 2020-11-20 DIAGNOSIS — G8929 Other chronic pain: Secondary | ICD-10-CM

## 2020-11-20 DIAGNOSIS — R29898 Other symptoms and signs involving the musculoskeletal system: Secondary | ICD-10-CM

## 2020-11-20 DIAGNOSIS — M6281 Muscle weakness (generalized): Secondary | ICD-10-CM | POA: Diagnosis not present

## 2020-11-20 NOTE — Patient Instructions (Signed)
Access Code: [email or web address]: [email or web address]: 04/04/2022Prepared by: Ayleen Mckinstry HoltExercises  Prone Quadriceps Stretch with Strap - 2 x daily - 7 x weekly - 1 sets - 3 reps - 30 sec hold  Hooklying Hamstring Stretch with Strap - 2 x daily - 7 x weekly - 1 sets - 3 reps - 30 sec hold  Supine Piriformis Stretch with Leg Straight - 2 x daily - 7 x weekly - 1 sets - 3 reps - 30 sec hold  Supine Piriformis Stretch with Foot on Ground - 2 x daily - 7 x weekly - 1 sets - 2-3 reps - 20 hold  Sit to Stand Without Arm Support - 2 x daily - 7 x weekly - 1 sets - 5-10 reps  Doorway Pec Stretch at 90 Degrees Abduction - 2 x daily - 7 x weekly - 1 sets - 2 reps - 15 sec hold  Sidelying Thoracic Rotation with Open Book - 1 x daily - 7 x weekly - 1 sets - 5 reps - 5 seconds hold  Standing Bilateral Low Shoulder Row with Anchored Resistance - 2 x daily - 7 x weekly - 1-3 sets - 10 reps - 2-3 sec hold  Shoulder Extension with Resistance - 2 x daily - 7 x weekly - 1-3 sets - 10 reps - 2-3 sec hold  Anti-Rotation Lateral Stepping with Press - 2 x daily - 7 x weekly - 1-2 sets - 10 reps - 2-3 sec hold

## 2020-11-20 NOTE — Therapy (Signed)
[facility] [place] [address] [place], [place], [address] Phone: [phone]   Fax:  [phone]  Physical Therapy Treatment  Patient Details  Name: Jonathon Zimmerman MRN: [id number] Date of Birth: 04-Dec-1955 Referring Provider (PT): Dr Julio Sicks   Encounter Date: 11/20/2020   PT End of Session - 11/20/20 1103    Visit Number 7    Number of Visits 12    Date for PT Re-Evaluation 12/13/20    PT Start Time 1101    PT Stop Time 1142    PT Time Calculation (min) 41 min           Past Medical History:  Diagnosis Date  . Cervical disc disorder   . Dysphagia   . GERD (gastroesophageal reflux disease)   . Hypertension   . IDA (iron deficiency anemia)   . Spinal stenosis     Past Surgical History:  Procedure Laterality Date  . EYE SURGERY Bilateral 2004   Lasix  . JOINT REPLACEMENT    . neck surgery    . REPLACEMENT TOTAL KNEE Right   . VASECTOMY  1984    There were no vitals filed for this visit.   Subjective Assessment - 11/20/20 1105    Subjective Pain presists - never pain free in his back and does not think he will be but "we will see". Pain is better when the weather is better.    Currently in Pain? Yes    Pain Score 5     Pain Location Back    Pain Orientation Right;Left;Lower    Pain Descriptors / Indicators Sore;Aching    Pain Type Chronic pain                             [facility] Adult PT Treatment/Exercise - 11/20/20 0001      Lumbar Exercises: Aerobic   Recumbent Bike L2 x 5 min      Lumbar Exercises: Standing   Lifting From 12";10 reps   ketlebell lift VC for hinged hips and core engaged   Forward Lunge Limitations forward step up 6 in step Lt/ Rt x 10 reps x 2 sets min use of UE    Push / Pull Sled 20 ft x 5 reps push 5 reps pull no weight    Row Strengthening;Both;20 reps;Theraband    Theraband Level (Row) Level 4 (Blue)    Row Limitations bow and arrow x 10 reps x 2 sets     Shoulder Extension Strengthening;Both;20 reps;Theraband    Theraband Level (Shoulder Extension) Level 4 (Blue)    Other Standing Lumbar Exercises antirotation green TB x 10 reps x 2 sets each side    Other Standing Lumbar Exercises carry over head 5# Kettlebell at 90/90 shoulder/elbow flexion; suitcase carry 10 # KB x 2 sets of each x 80 ft carry      Lumbar Exercises: Seated   Sit to Stand 10 reps   2 sets of 10   Sit to Stand Limitations cues for hip hinge, straight back.                  PT Education - 11/20/20 1126    Education Details HEP issued blue TB    Person(s) Educated Patient    Methods Explanation;Demonstration;Tactile cues;Verbal cues;Handout    Comprehension Verbalized understanding;Returned demonstration;Verbal cues required;Tactile cues required  PT Long Term Goals - 11/16/20 1254      PT LONG TERM GOAL #1   Title Decrease pain by 25% allowing patient to participate in exercise and functional activites with greater ease    Time 6    Period Weeks    Status On-going      PT LONG TERM GOAL #2   Title Increase LE strength by 1/2 to 1 muscle grade    Time 6    Period Weeks    Status On-going      PT LONG TERM GOAL #3   Title Improve posture and algnment in standing therefore improve gait pattern    Time 6    Period Weeks    Status On-going      PT LONG TERM GOAL #4   Title Independent in HEP including gym program    Time 6    Period Weeks    Status On-going      PT LONG TERM GOAL #5   Title Improve functional limitation score to 55    Time 6    Period Weeks    Status On-going                 Plan - 11/20/20 1106    Clinical Impression Statement Pain persists. Continue to work on core strength and stabilization. Added TB exercises in standing as well as carry tasks.    Rehab Potential Good    PT Frequency 2x / week    PT Duration 6 weeks    PT Treatment/Interventions ADLs/Self Care Home Management;Aquatic  Therapy;Cryotherapy;Electrical Stimulation;Iontophoresis 4mg /ml Dexamethasone;Moist Heat;Ultrasound;Gait training;Stair training;Functional mobility training;Therapeutic activities;Therapeutic exercise;Balance training;Neuromuscular re-education;Patient/family education;Manual techniques;Dry needling;Taping    PT Next Visit Plan continue with core stabilization, LE stretches and postural strengthening.  Progress HEP as tolerated.    PT Home Exercise Plan    Consulted and Agree with Plan of Care Patient           Patient will benefit from skilled therapeutic intervention in order to improve the following deficits and impairments:     Visit Diagnosis: Chronic bilateral low back pain, unspecified whether sciatica present  Muscle weakness (generalized)  Other symptoms and signs involving the musculoskeletal system  Other abnormalities of gait and mobility     Problem List Patient Active Problem List   Diagnosis Date Noted  . Degenerative spondylolisthesis 06/19/2020  . Iron deficiency anemia 01/14/2020  . Preop cardiovascular exam 01/14/2020  . Dysphagia 01/14/2020  . Cervical radiculopathy 12/08/2019  . Essential hypertension 12/08/2019  . Gastroesophageal reflux disease without esophagitis 12/08/2019    Marla Pouliot 12/10/2019 PT, MPH  11/20/2020, 11:42 AM  [facility] [address] [place], [place], [place] Phone: [phone]   Fax:  [phone]  Name: Jousha Schwandt MRN: [name] Jack Date of Birth: 07-01-1956

## 2020-11-23 ENCOUNTER — Other Ambulatory Visit: Payer: Self-pay

## 2020-11-23 ENCOUNTER — Encounter: Payer: Self-pay | Admitting: Rehabilitative and Restorative Service Providers"

## 2020-11-23 ENCOUNTER — Ambulatory Visit (INDEPENDENT_AMBULATORY_CARE_PROVIDER_SITE_OTHER): Payer: No Typology Code available for payment source | Admitting: Rehabilitative and Restorative Service Providers"

## 2020-11-23 DIAGNOSIS — R2689 Other abnormalities of gait and mobility: Secondary | ICD-10-CM | POA: Diagnosis not present

## 2020-11-23 DIAGNOSIS — M545 Low back pain, unspecified: Secondary | ICD-10-CM

## 2020-11-23 DIAGNOSIS — R29898 Other symptoms and signs involving the musculoskeletal system: Secondary | ICD-10-CM | POA: Diagnosis not present

## 2020-11-23 DIAGNOSIS — G8929 Other chronic pain: Secondary | ICD-10-CM

## 2020-11-23 DIAGNOSIS — M6281 Muscle weakness (generalized): Secondary | ICD-10-CM

## 2020-11-23 NOTE — Therapy (Signed)
[facility] [place] [address] [place], [place], [address] Phone: [phone]   Fax:  [phone]  Physical Therapy Treatment  Patient Details  Name: Jonathon Zimmerman MRN: [id number] Date of Birth: Feb 06, 1956 Referring Provider (PT): Dr Julio Sicks   Encounter Date: 11/23/2020   PT End of Session - 11/23/20 1105    Visit Number 8    Number of Visits 12    Date for PT Re-Evaluation 12/13/20    PT Start Time 1102    PT Stop Time 1145    PT Time Calculation (min) 43 min           Past Medical History:  Diagnosis Date  . Cervical disc disorder   . Dysphagia   . GERD (gastroesophageal reflux disease)   . Hypertension   . IDA (iron deficiency anemia)   . Spinal stenosis     Past Surgical History:  Procedure Laterality Date  . EYE SURGERY Bilateral 2004   Lasix  . JOINT REPLACEMENT    . neck surgery    . REPLACEMENT TOTAL KNEE Right   . VASECTOMY  1984    There were no vitals filed for this visit.   Subjective Assessment - 11/23/20 1125    Subjective Continued stiffness - which is due to rainy weather today.    Currently in Pain? Yes    Pain Score 3     Pain Location Back    Pain Orientation Right;Left;Lower    Pain Descriptors / Indicators Tightness    Pain Type Chronic pain              [facility] PT Assessment - 11/23/20 0001      Assessment   Medical Diagnosis Spondylolisthesis lumbar spine post interbody fusion L4/5; L5/S1    Referring Provider (PT) Dr Julio Sicks    Onset Date/Surgical Date 06/19/20    Hand Dominance Right    Next MD Visit 4/27/222    Prior Therapy PT for Rt TKA none for LB or neck      Special Tests   Other special tests leg length difference ASIS to medial mal - Rt 97.5 cm, Lt 95.5 cm; greater trochanter to med mal Rt 97 cm, Lt 96 cm      Ambulation/Gait   Gait Comments improved gait pattern with addition of heel lift                         [facility] Adult PT  Treatment/Exercise - 11/23/20 0001      Lumbar Exercises: Aerobic   Nustep L5: arms/legs x 6 min      Lumbar Exercises: Standing   Push / Pull Sled 20 ft x 5 reps push 5 reps pull no weight   added 20# to sled for last 4 reps   Row Strengthening;Both;20 reps;Theraband    Theraband Level (Row) Level 4 (Blue)    Row Limitations bow and arrow blue TB 10 reps x 2 sets each UE    Shoulder Extension Strengthening;Both;20 reps;Theraband    Theraband Level (Shoulder Extension) Level 4 (Blue)    Other Standing Lumbar Exercises antirotation green TB x 10 reps x 2 sets each side      Lumbar Exercises: Seated   Sit to Stand 10 reps   2 sets of 10   Sit to Stand Limitations cues for hip hinge, straight back.      Knee/Hip Exercises: Standing   Lateral Step Up Right;Left;2 sets;10  reps;Hand Hold: 1;Step Height: 4"   heel tap   Forward Step Up Right;Left;2 sets;10 reps;Hand Hold: 1;Hand Hold: 0;Step Height: 6"                       PT Long Term Goals - 11/16/20 1254      PT LONG TERM GOAL #1   Title Decrease pain by 25% allowing patient to participate in exercise and functional activites with greater ease    Time 6    Period Weeks    Status On-going      PT LONG TERM GOAL #2   Title Increase LE strength by 1/2 to 1 muscle grade    Time 6    Period Weeks    Status On-going      PT LONG TERM GOAL #3   Title Improve posture and algnment in standing therefore improve gait pattern    Time 6    Period Weeks    Status On-going      PT LONG TERM GOAL #4   Title Independent in HEP including gym program    Time 6    Period Weeks    Status On-going      PT LONG TERM GOAL #5   Title Improve functional limitation score to 55    Time 6    Period Weeks    Status On-going                 Plan - 11/23/20 1123    Clinical Impression Statement Painful and stiff per pt report - may be due to rainy weather. Has done some of the exercises at home. Tolerates exercises well in  clinic. Returns to MD 12/07/20. Will continue 2x/wk until RTD. Measurement of leg length - Lt LE measures shorter than Rt. Added heel lift Lt shoe with notable improvement in gait pattern and pt reports that the gait feels better.    Rehab Potential Good    PT Frequency 2x / week    PT Duration 6 weeks    PT Treatment/Interventions ADLs/Self Care Home Management;Aquatic Therapy;Cryotherapy;Electrical Stimulation;Iontophoresis 4mg /ml Dexamethasone;Moist Heat;Ultrasound;Gait training;Stair training;Functional mobility training;Therapeutic activities;Therapeutic exercise;Balance training;Neuromuscular re-education;Patient/family education;Manual techniques;Dry needling;Taping    PT Next Visit Plan continue with core stabilization, LE stretches and postural strengthening.  Progress HEP as tolerated. Assess response to heel lift    PT Home Exercise Plan    Consulted and Agree with Plan of Care Patient           Patient will benefit from skilled therapeutic intervention in order to improve the following deficits and impairments:     Visit Diagnosis: Chronic bilateral low back pain, unspecified whether sciatica present  Muscle weakness (generalized)  Other symptoms and signs involving the musculoskeletal system  Other abnormalities of gait and mobility     Problem List Patient Active Problem List   Diagnosis Date Noted  . Degenerative spondylolisthesis 06/19/2020  . Iron deficiency anemia 01/14/2020  . Preop cardiovascular exam 01/14/2020  . Dysphagia 01/14/2020  . Cervical radiculopathy 12/08/2019  . Essential hypertension 12/08/2019  . Gastroesophageal reflux disease without esophagitis 12/08/2019    12/10/2019 PT MPH  11/23/2020, 11:47 AM  [facility] [address] [place], [place], [place] Phone: [phone]   Fax:  [phone]  Name: Jonathon Zimmerman MRN: [name] Jack Date of Birth: [birth year]

## 2020-11-29 ENCOUNTER — Ambulatory Visit (INDEPENDENT_AMBULATORY_CARE_PROVIDER_SITE_OTHER): Payer: No Typology Code available for payment source | Admitting: Physical Therapy

## 2020-11-29 ENCOUNTER — Other Ambulatory Visit: Payer: Self-pay

## 2020-11-29 ENCOUNTER — Encounter: Payer: Self-pay | Admitting: Physical Therapy

## 2020-11-29 DIAGNOSIS — M6281 Muscle weakness (generalized): Secondary | ICD-10-CM | POA: Diagnosis not present

## 2020-11-29 DIAGNOSIS — R29898 Other symptoms and signs involving the musculoskeletal system: Secondary | ICD-10-CM | POA: Diagnosis not present

## 2020-11-29 DIAGNOSIS — G8929 Other chronic pain: Secondary | ICD-10-CM | POA: Diagnosis not present

## 2020-11-29 DIAGNOSIS — M545 Low back pain, unspecified: Secondary | ICD-10-CM

## 2020-11-29 NOTE — Therapy (Signed)
[place], [place], [address] Phone: [phone]   Fax:  [phone]  Physical Therapy Treatment  Patient Details  Name: Jonathon Zimmerman MRN: [id number] Date of Birth: [birth year] Referring Provider (PT): Dr Earnie Larsson   Encounter Date: 11/29/2020   PT End of Session - 11/29/20 1055    Visit Number 9    Number of Visits 12    Date for PT Re-Evaluation 12/13/20    PT Start Time 9735    PT Stop Time 1056    PT Time Calculation (min) 41 min    Activity Tolerance Patient tolerated treatment well    Behavior During Therapy [facility] for tasks assessed/performed           Past Medical History:  Diagnosis Date  . Cervical disc disorder   . Dysphagia   . GERD (gastroesophageal reflux disease)   . Hypertension   . IDA (iron deficiency anemia)   . Spinal stenosis     Past Surgical History:  Procedure Laterality Date  . EYE SURGERY Bilateral 2004   Lasix  . JOINT REPLACEMENT    . neck surgery    . REPLACEMENT TOTAL KNEE Right   . VASECTOMY  1984    There were no vitals filed for this visit.   Subjective Assessment - 11/29/20 1017    Subjective Pt reports the heel lift "felt pretty good". He'd like to order more for his other shoes.  He reports he rode the riding lawnmower 2 days ago, moving over pot holes in the yard.  This irritated his back.    Patient Stated Goals decrease stiffness in back; get function to do normal things to decrease stiffness    Currently in Pain? Yes    Pain Score 5     Pain Location Back    Pain Orientation Right;Left    Pain Descriptors / Indicators Sore    Aggravating Factors  prolonged standing    Pain Relieving Factors MHP, medication, stretches.              [facility] PT Assessment - 11/29/20 0001      Assessment   Medical Diagnosis Spondylolisthesis lumbar spine post interbody fusion L4/5; L5/S1    Referring Provider (PT) Dr Earnie Larsson    Onset  Date/Surgical Date 06/19/20    Hand Dominance Right    Next MD Visit 4/21/222    Prior Therapy PT for Rt TKA none for LB or neck      Strength   Right Hip Flexion 5/5    Right Hip Extension 4+/5    Right Hip ABduction 4-/5    Left Hip Flexion 4-/5    Left Hip Extension 3+/5    Left Hip ABduction 3+/5    Right/Left Ankle --   Rt 15 heel raises, Lt 10 heel raises (limited heel lift)            [facility] Adult PT Treatment/Exercise - 11/29/20 0001      Lumbar Exercises: Stretches   Passive Hamstring Stretch Right;Left;2 reps;20 seconds   standing, LE on chair, straight back   Other Lumbar Stretch Exercise seated, knee flexion stretch (pt demonstrating what he performs at home for increased Rt knee flexion).  forward lunge with foot on chair for increased knee flexion, UE support on wall x 10 sec each leg.      Lumbar Exercises: Aerobic   Recumbent Bike L1: 5.5 min      Lumbar Exercises: Supine  Straight Leg Raise 5 reps;3 seconds   with core engaged- LLE     Lumbar Exercises: Sidelying   Hip Abduction Left;Right;10 reps;3 seconds      Knee/Hip Exercises: Standing   Lateral Step Up Left;1 set;10 reps   eccentric lowering   Forward Step Up Left;1 set;10 reps;Step Height: 6";Hand Hold: 1   cues to engage glute and core.     Shoulder Exercises: Stretch   Other Shoulder Stretches midlevel and high doorway stretch x 20 sec x 3 reps each             PT Long Term Goals - 11/29/20 1025      PT LONG TERM GOAL #1   Title Decrease pain by 25% allowing patient to participate in exercise and functional activites with greater ease    Baseline fluctuates depending on weather and level of activity    Time 6    Period Weeks    Status Partially Met      PT LONG TERM GOAL #2   Title Increase LE strength by 1/2 to 1 muscle grade    Time 6    Period Weeks    Status Achieved      PT LONG TERM GOAL #3   Title Improve posture and algnment in standing therefore improve gait pattern     Time 6    Period Weeks    Status On-going      PT LONG TERM GOAL #4   Title Independent in HEP including gym program    Time 6    Period Weeks    Status On-going      PT LONG TERM GOAL #5   Title Improve functional limitation score to 55    Time 6    Period Weeks    Status On-going                 Plan - 11/29/20 1031    Clinical Impression Statement Positive response to heel lift in shoe.  Pt reporting some decrease in back pain,  but it fluctuates depending on weater and activity level. Some improvement in LE strength; continued weakness in L>R.  Pt requires good support for neck when in sidelying for Hip strengthening exercises. Progressing towards goals.    Rehab Potential Good    PT Frequency 2x / week    PT Duration 6 weeks    PT Treatment/Interventions ADLs/Self Care Home Management;Aquatic Therapy;Cryotherapy;Electrical Stimulation;Iontophoresis 44m/ml Dexamethasone;Moist Heat;Ultrasound;Gait training;Stair training;Functional mobility training;Therapeutic activities;Therapeutic exercise;Balance training;Neuromuscular re-education;Patient/family education;Manual techniques;Dry needling;Taping    PT Next Visit Plan FOTO and MD note. Assess remaining goals.    PT Home Exercise Plan [id number]   Consulted and Agree with Plan of Care Patient           Patient will benefit from skilled therapeutic intervention in order to improve the following deficits and impairments:  Abnormal gait,Decreased range of motion,Increased fascial restricitons,Increased muscle spasms,Decreased activity tolerance,Pain,Decreased balance,Impaired flexibility,Improper body mechanics,Decreased mobility,Decreased strength,Postural dysfunction  Visit Diagnosis: Chronic bilateral low back pain, unspecified whether sciatica present  Muscle weakness (generalized)  Other symptoms and signs involving the musculoskeletal system     Problem List Patient Active Problem List   Diagnosis Date Noted   . Degenerative spondylolisthesis 06/19/2020  . Iron deficiency anemia 01/14/2020  . Preop cardiovascular exam 01/14/2020  . Dysphagia 01/14/2020  . Cervical radiculopathy 12/08/2019  . Essential hypertension 12/08/2019  . Gastroesophageal reflux disease without esophagitis 12/08/2019   JKerin Perna PTA 11/29/20 11:03 AM  [facility] [place], [place], [address] Phone: [phone]   Fax:  [phone]  Name: Jonathon Zimmerman MRN: [id number] Date of Birth: 06/30/1956

## 2020-12-06 ENCOUNTER — Encounter: Payer: Self-pay | Admitting: Rehabilitative and Restorative Service Providers"

## 2020-12-06 ENCOUNTER — Other Ambulatory Visit: Payer: Self-pay

## 2020-12-06 ENCOUNTER — Ambulatory Visit (INDEPENDENT_AMBULATORY_CARE_PROVIDER_SITE_OTHER): Payer: No Typology Code available for payment source | Admitting: Rehabilitative and Restorative Service Providers"

## 2020-12-06 DIAGNOSIS — R2689 Other abnormalities of gait and mobility: Secondary | ICD-10-CM | POA: Diagnosis not present

## 2020-12-06 DIAGNOSIS — R29898 Other symptoms and signs involving the musculoskeletal system: Secondary | ICD-10-CM | POA: Diagnosis not present

## 2020-12-06 DIAGNOSIS — G8929 Other chronic pain: Secondary | ICD-10-CM

## 2020-12-06 DIAGNOSIS — M6281 Muscle weakness (generalized): Secondary | ICD-10-CM

## 2020-12-06 DIAGNOSIS — M545 Low back pain, unspecified: Secondary | ICD-10-CM

## 2020-12-06 NOTE — Therapy (Signed)
[place], [place], [address] Phone: [phone]   Fax:  [phone]  Physical Therapy Treatment  Progress Note Reporting Period 11/01/20 to 12/06/20  See note below for Objective Data and Assessment of Progress/Goals.      Patient Details  Name: Jonathon Zimmerman MRN: [id number] Date of Birth: 09/18/1955 Referring Provider (PT): Dr Earnie Larsson   Encounter Date: 12/06/2020   PT End of Session - 12/06/20 1103    Visit Number 10    Number of Visits 12    Date for PT Re-Evaluation 12/13/20    PT Start Time 1100    PT Stop Time 1145    PT Time Calculation (min) 45 min    Activity Tolerance Patient tolerated treatment well           Past Medical History:  Diagnosis Date  . Cervical disc disorder   . Dysphagia   . GERD (gastroesophageal reflux disease)   . Hypertension   . IDA (iron deficiency anemia)   . Spinal stenosis     Past Surgical History:  Procedure Laterality Date  . EYE SURGERY Bilateral 2004   Lasix  . JOINT REPLACEMENT    . neck surgery    . REPLACEMENT TOTAL KNEE Right   . VASECTOMY  1984    There were no vitals filed for this visit.   Subjective Assessment - 12/06/20 1104    Subjective Back is better than the first day he came. He has some tightness in the back. He has x-rays tomorrow. He is doing some exercises at home. Heel lift is helpful.    Currently in Pain? Yes    Pain Score 5     Pain Location Back    Pain Orientation Right;Left    Pain Descriptors / Indicators Sore    Pain Type Chronic pain    Pain Onset More than a month ago    Pain Frequency Constant   pain gets down to a 1 or 2/10             [facility] PT Assessment - 12/06/20 0001      Assessment   Medical Diagnosis Spondylolisthesis lumbar spine post interbody fusion L4/5; L5/S1    Referring Provider (PT) Dr Earnie Larsson    Onset Date/Surgical Date 06/19/20    Hand Dominance Right    Next MD  Visit 4/21/222    Prior Therapy PT for Rt TKA none for LB or neck      Observation/Other Assessments   Focus on Therapeutic Outcomes (FOTO)  53      AROM   Lumbar Flexion 70% pain in the LB    Lumbar Extension 25% pain in LB    Lumbar - Right Side Bend 85% stiffness and min pain in LB    Lumbar - Left Side Bend 85% min pain in LB    Lumbar - Right Rotation 60%    Lumbar - Left Rotation 60% min pain in the LB      Strength   Right Hip Flexion 5/5    Right Hip Extension 4+/5    Right Hip ABduction 4-/5    Left Hip Flexion 4-/5    Left Hip Extension 3+/5    Left Hip ABduction 3+/5    Right Knee Flexion 5/5    Right Knee Extension 5/5    Left Knee Flexion --   5-/5   Left Knee Extension --   5-/5  Flexibility   Hamstrings tight Rt 77 deg; Lt 68 deg    Quadriceps Rt - 100 deg, Lt - 116 deg    ITB tight bilat - improving    Piriformis tight bilat - improving      Palpation   Spinal mobility fusion L4/5; L5/S1    Palpation comment muscular tightness through the lumbar paraspinals; QL; lats; posterior hips bilat      Special Tests   Other special tests leg length difference ASIS to medial mal - Rt 97.5 cm, Lt 95.5 cm; greater trochanter to med mal Rt 97 cm, Lt 96 cm                         [facility] Adult PT Treatment/Exercise - 12/06/20 0001      Lumbar Exercises: Stretches   Passive Hamstring Stretch Right;Left;2 reps;20 seconds   standing, LE on chair, straight back   Quad Stretch Right;Left;2 reps;30 seconds    Gastroc Stretch Right;Left;3 reps;30 seconds   heel off step     Lumbar Exercises: Aerobic   Recumbent Bike L2: 5.5 min      Lumbar Exercises: Standing   Wall Slides 10 reps;5 seconds    Other Standing Lumbar Exercises antirotation green TB x 10 reps x 2 sets each side    Other Standing Lumbar Exercises push and pull against green TB resistance 10 reps x 1 set each      Lumbar Exercises: Seated   Sit to Stand 10 reps    Sit to Stand  Limitations cues for hip hinge, straight back.                       PT Long Term Goals - 12/06/20 1125      PT LONG TERM GOAL #1   Title Decrease pain by 25% allowing patient to participate in exercise and functional activites with greater ease    Baseline fluctuates depending on weather and level of activity    Time 6    Period Weeks    Status Achieved      PT LONG TERM GOAL #2   Title Increase LE strength by 1/2 to 1 muscle grade    Time 6    Period Weeks    Status Achieved      PT LONG TERM GOAL #3   Title Improve posture and algnment in standing therefore improve gait pattern    Time 6    Period Weeks    Status Partially Met      PT LONG TERM GOAL #4   Title Independent in HEP including gym program    Time 6    Period Weeks    Status Partially Met      PT LONG TERM GOAL #5   Title Improve functional limitation score to 55    Time 6    Period Weeks    Status Partially Met                 Plan - 12/06/20 1129    Clinical Impression Statement Patient has made gradual progress reporting decreased pain level and increased functional abilities. He demonstrates increased LE strength; improved tissue extensibility and ROM in LE's; improving exercise endurance; increased functional activities. He has accomplised part of his rehab goals and is progressing with remainder of goals. Jonathon Zimmerman will benefit from continued therapy to accomplish rehab potential.    Rehab Potential Good    PT Frequency  2x / week    PT Duration 6 weeks    PT Treatment/Interventions ADLs/Self Care Home Management;Aquatic Therapy;Cryotherapy;Electrical Stimulation;Iontophoresis 11m/ml Dexamethasone;Moist Heat;Ultrasound;Gait training;Stair training;Functional mobility training;Therapeutic activities;Therapeutic exercise;Balance training;Neuromuscular re-education;Patient/family education;Manual techniques;Dry needling;Taping    PT Next Visit Plan Continue stretching and strengthening;  working on functional activities    [id number]   Consulted and Agree with Plan of Care Patient           Patient will benefit from skilled therapeutic intervention in order to improve the following deficits and impairments:     Visit Diagnosis: Chronic bilateral low back pain, unspecified whether sciatica present  Muscle weakness (generalized)  Other symptoms and signs involving the musculoskeletal system  Other abnormalities of gait and mobility     Problem List Patient Active Problem List   Diagnosis Date Noted  . Degenerative spondylolisthesis 06/19/2020  . Iron deficiency anemia 01/14/2020  . Preop cardiovascular exam 01/14/2020  . Dysphagia 01/14/2020  . Cervical radiculopathy 12/08/2019  . Essential hypertension 12/08/2019  . Gastroesophageal reflux disease without esophagitis 12/08/2019    Kimley Apsey PNilda SimmerPT, MPH  12/06/2020, 12:48 PM  [facility] [address]Phone: [phone]  Fax:  [phone] Name: Jonathon MarkmanMRN: [id number] of Birth: 910-16-1957

## 2020-12-08 ENCOUNTER — Encounter: Payer: Non-veteran care | Admitting: Physical Therapy

## 2020-12-13 ENCOUNTER — Other Ambulatory Visit: Payer: Self-pay

## 2020-12-13 ENCOUNTER — Ambulatory Visit (INDEPENDENT_AMBULATORY_CARE_PROVIDER_SITE_OTHER): Payer: No Typology Code available for payment source | Admitting: Physical Therapy

## 2020-12-13 DIAGNOSIS — M6281 Muscle weakness (generalized): Secondary | ICD-10-CM

## 2020-12-13 DIAGNOSIS — G8929 Other chronic pain: Secondary | ICD-10-CM

## 2020-12-13 DIAGNOSIS — M545 Low back pain, unspecified: Secondary | ICD-10-CM | POA: Diagnosis not present

## 2020-12-13 DIAGNOSIS — R29898 Other symptoms and signs involving the musculoskeletal system: Secondary | ICD-10-CM | POA: Diagnosis not present

## 2020-12-13 NOTE — Therapy (Addendum)
Jonathon Zimmerman, [place], [address] Phone: [phone]   Fax:  [phone]  Physical Therapy Treatment  Patient Details  Name: Jonathon Zimmerman MRN: [id number] Date of Birth: 1955/12/08 Referring Provider (PT): Dr Earnie Larsson   Encounter Date: 12/13/2020   PT End of Session - 12/13/20 1131    Visit Number 11    Number of Visits 12    Date for PT Re-Evaluation 12/13/20    PT Start Time 1101    PT Stop Time 1143    PT Time Calculation (min) 42 min    Activity Tolerance Patient tolerated treatment well    Behavior During Therapy [facility] for tasks assessed/performed           Past Medical History:  Diagnosis Date  . Cervical disc disorder   . Dysphagia   . GERD (gastroesophageal reflux disease)   . Hypertension   . IDA (iron deficiency anemia)   . Spinal stenosis     Past Surgical History:  Procedure Laterality Date  . EYE SURGERY Bilateral 2004   Lasix  . JOINT REPLACEMENT    . neck surgery    . REPLACEMENT TOTAL KNEE Right   . VASECTOMY  1984    There were no vitals filed for this visit.   Subjective Assessment - 12/13/20 1113    Subjective Pt reports increased LB stiffness with the cooler weather this morning.  He had follow up with Dr. Trenton Gammon and was told he could basically do any exercises he wants.  "I'm not going to overdo it, though".    Patient Stated Goals decrease stiffness in back; get function to do normal things to decrease stiffness    Currently in Pain? Yes    Pain Score 5     Pain Location Back    Pain Orientation Right;Left;Lower    Pain Descriptors / Indicators Aching;Tightness    Aggravating Factors  prolonged standing    Pain Relieving Factors MHP, medication, stretches.              [facility] PT Assessment - 12/13/20 0001      Assessment   Medical Diagnosis Spondylolisthesis lumbar spine post interbody fusion L4/5; L5/S1    Referring Provider (PT) Dr Earnie Larsson     Onset Date/Surgical Date 06/19/20    Hand Dominance Right    Prior Therapy PT for Rt TKA none for LB or neck             [facility] Adult PT Treatment/Exercise - 12/13/20 0001      Lumbar Exercises: Stretches   Passive Hamstring Stretch Right;Left;2 reps;20 seconds   foot on 12" step   Gastroc Stretch Right;Left;2 reps;20 seconds    Other Lumbar Stretch Exercise standing trunk rotation holding dowel on shoulders; then side bending side to side      Lumbar Exercises: Aerobic   Recumbent Bike L2: 6 min      Lumbar Exercises: Machines for Strengthening   Leg Press single leg LLE x 10 with 5 plates, bilat legs 10 plates x 2 sets of 10.      Lumbar Exercises: Standing   Other Standing Lumbar Exercises mini-squat with 15# KB x 5 reps      Shoulder Exercises: Stretch   Other Shoulder Stretches midlevel doorway stretch x 15 sec x 3 reps, then pec stretch with arm abdct 90 deg (against wall) with arm straight x 15 sec x 2 reps each side.  Reviewed current exercises at gym and discussed weight equipment to use/ avoid at [employer].  Pt verbalized understanding.     PT Long Term Goals - 12/06/20 1125      PT LONG TERM GOAL #1   Title Decrease pain by 25% allowing patient to participate in exercise and functional activites with greater ease    Baseline fluctuates depending on weather and level of activity    Time 6    Period Weeks    Status Achieved      PT LONG TERM GOAL #2   Title Increase LE strength by 1/2 to 1 muscle grade    Time 6    Period Weeks    Status Achieved      PT LONG TERM GOAL #3   Title Improve posture and algnment in standing therefore improve gait pattern    Time 6    Period Weeks    Status Partially Met      PT LONG TERM GOAL #4   Title Independent in HEP including gym program    Time 6    Period Weeks    Status Achieved     PT LONG TERM GOAL #5   Title Improve functional limitation score to 55    Time 6    Period Weeks    Status  Partially Met                 Plan - 12/13/20 1303    Clinical Impression Statement Pt tolerated all exercises well, with report of reduction in LBP.  Pt verbalized readiness to d/c to gym program.  He has partially met his goals, and is pleased with current level of function.    Rehab Potential Good    PT Frequency 2x / week    PT Duration 6 weeks    PT Treatment/Interventions ADLs/Self Care Home Management;Aquatic Therapy;Cryotherapy;Electrical Stimulation;Iontophoresis 28m/ml Dexamethasone;Moist Heat;Ultrasound;Gait training;Stair training;Functional mobility training;Therapeutic activities;Therapeutic exercise;Balance training;Neuromuscular re-education;Patient/family education;Manual techniques;Dry needling;Taping    PT Next Visit Plan spoke to supervising PT; will d/c.    PT Home Exercise Plan [id number]   Consulted and Agree with Plan of Care Patient           Patient will benefit from skilled therapeutic intervention in order to improve the following deficits and impairments:  Abnormal gait,Decreased range of motion,Increased fascial restricitons,Increased muscle spasms,Decreased activity tolerance,Pain,Decreased balance,Impaired flexibility,Improper body mechanics,Decreased mobility,Decreased strength,Postural dysfunction  Visit Diagnosis: Chronic bilateral low back pain, unspecified whether sciatica present  Muscle weakness (generalized)  Other symptoms and signs involving the musculoskeletal system     Problem List Patient Active Problem List   Diagnosis Date Noted  . Degenerative spondylolisthesis 06/19/2020  . Iron deficiency anemia 01/14/2020  . Preop cardiovascular exam 01/14/2020  . Dysphagia 01/14/2020  . Cervical radiculopathy 12/08/2019  . Essential hypertension 12/08/2019  . Gastroesophageal reflux disease without esophagitis 12/08/2019   Jonathon Zimmerman 12/13/20 1:16 PM  [facility] [address] [address]Phone: [phone]  Fax:  [phone] Name: Jonathon CreggerMRN: [id number] of Birth: [birth year] PHYSICAL THERAPY DISCHARGE SUMMARY  Visits from Start of Care: 11  Current functional level related to goals / functional outcomes: See progress note for discharge status    Remaining deficits: Needs to continue with independent HEP    Education / Equipment: HEP   Plan: Patient agrees to discharge.  Patient goals were partially met. Patient is being discharged due  to being pleased with the current functional level.  ?????    Celyn P. Helene Kelp PT, MPH 12/14/20 3:07 PM

## 2020-12-15 ENCOUNTER — Encounter: Payer: Non-veteran care | Admitting: Physical Therapy

## 2022-05-16 IMAGING — RF DG C-ARM 1-60 MIN
1 series · 2 of 2 positions shown · non-contrast
Comparison: Lumbar spine MRI 09/07/2019.

CLINICAL DATA: Elective surgery. Additional history provided:
Posterior lumbar interbody fusion, lumbar 4-lumbar 5, lumbar
5-sacral 1. Provided fluoroscopy time: 43.7 seconds (28.04 mGy).

EXAM:
LUMBAR SPINE - 2-3 VIEW; DG C-ARM 1-60 MIN

[Series 1: run · 2 of 2 slices shown]
[im 1/2]
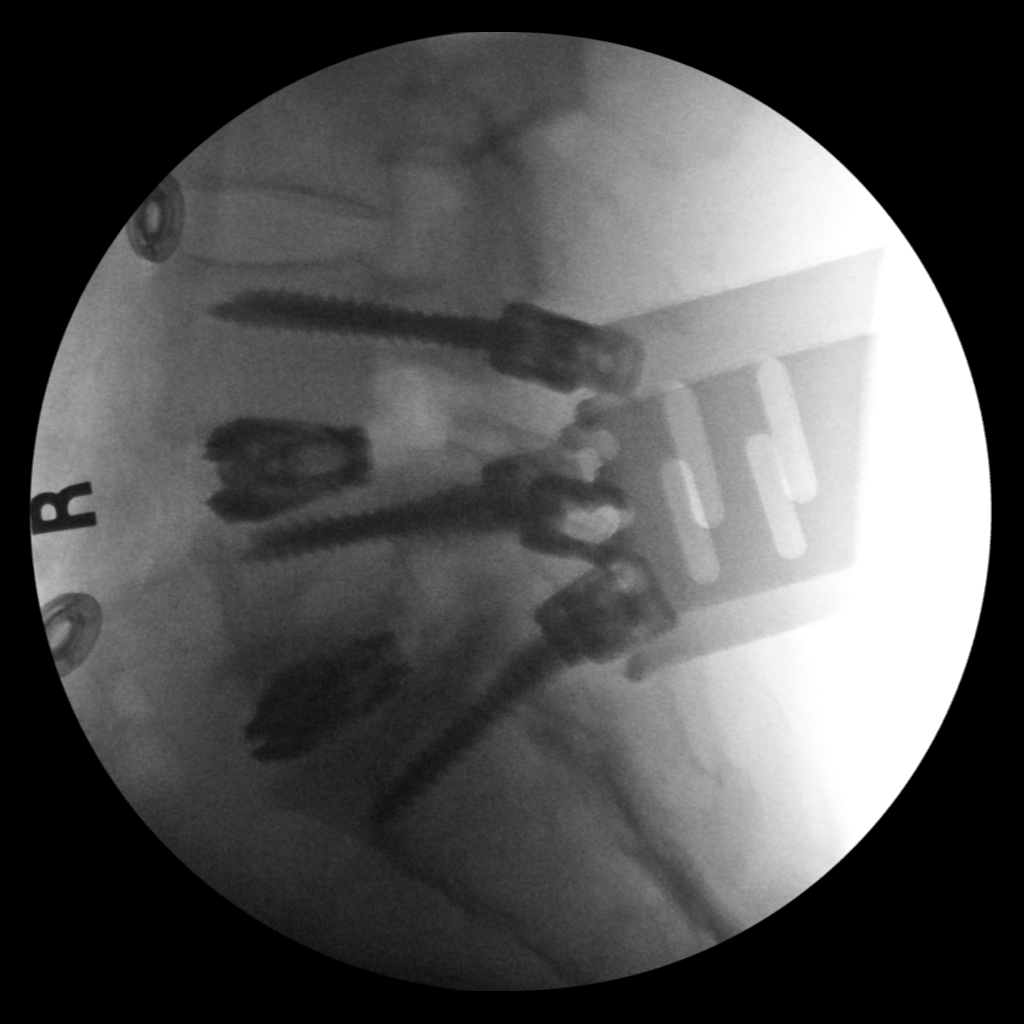
[im 2/2]
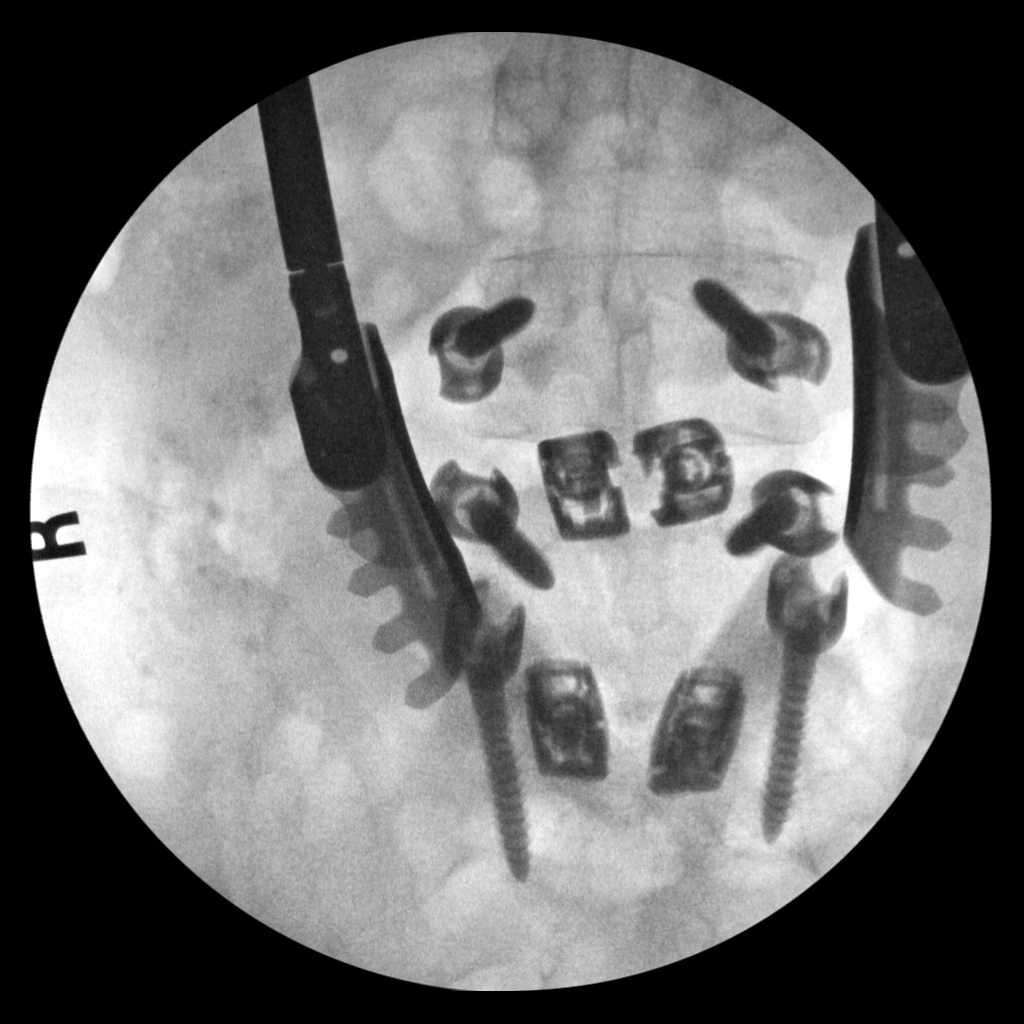

[2 of 2 positions shown; findings below may reference images not displayed]

FINDINGS: PA and lateral view intraoperative fluoroscopic images of the lumbar
spine are submitted, 2 images total.

Standard lumbar spinal numbering is presumed. The lowest well-formed
intervertebral disc space is designated L5-S1.

The submitted images demonstrate interbody spacers at the L4-L5 and
L5-S1 levels. Bilateral pedicle screws at L3, L4 and S1. Vertical
interconnecting rods were not present at the time the images were
taken. Overlying retractors.
IMPRESSION: Two intraoperative fluoroscopic images of the lumbar spine, as
described.

## 2022-05-16 IMAGING — RF DG LUMBAR SPINE 2-3V
1 series · 2 of 2 positions shown · non-contrast
Comparison: Lumbar spine MRI 09/07/2019.

CLINICAL DATA: Elective surgery. Additional history provided:
Posterior lumbar interbody fusion, lumbar 4-lumbar 5, lumbar
5-sacral 1. Provided fluoroscopy time: 43.7 seconds (28.04 mGy).

EXAM:
LUMBAR SPINE - 2-3 VIEW; DG C-ARM 1-60 MIN

[Series 1: run · 2 of 2 slices shown]
[im 1/2]
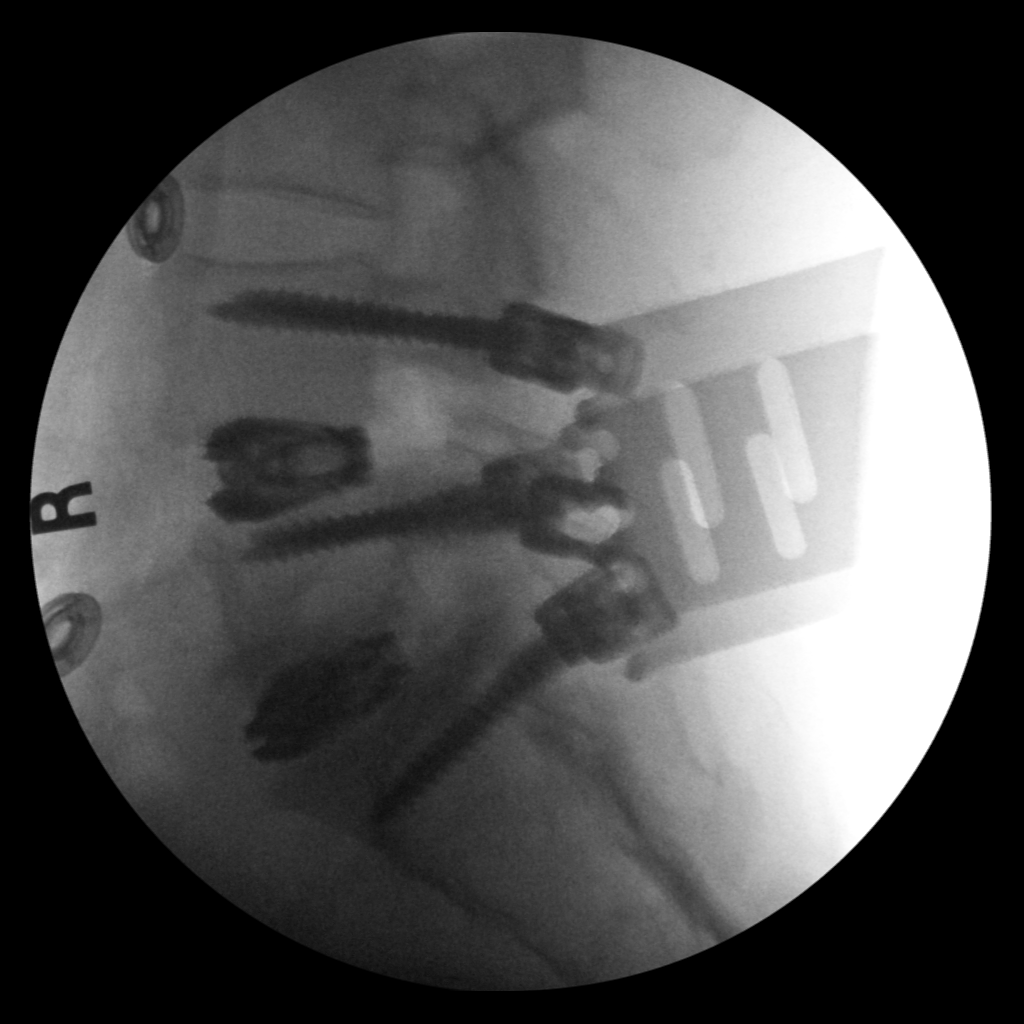
[im 2/2]
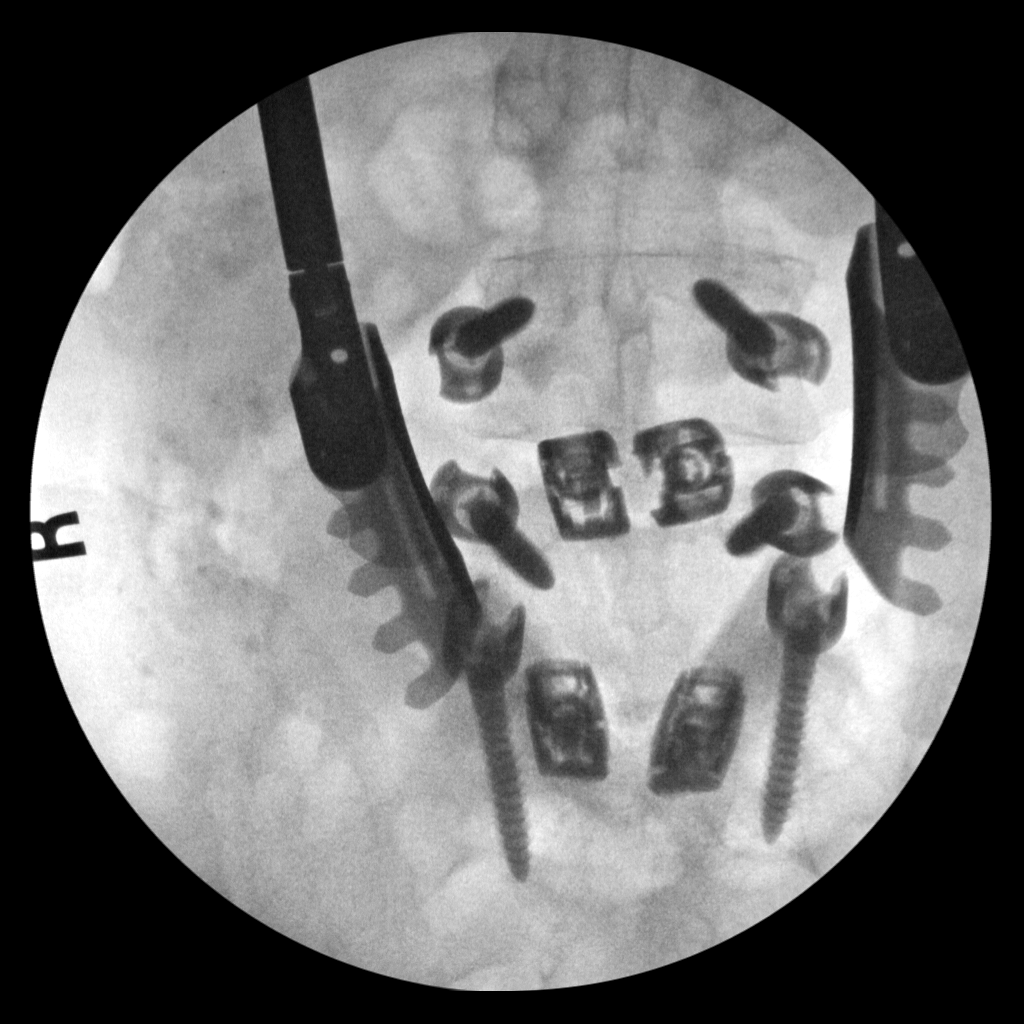

[2 of 2 positions shown; findings below may reference images not displayed]

FINDINGS: PA and lateral view intraoperative fluoroscopic images of the lumbar
spine are submitted, 2 images total.

Standard lumbar spinal numbering is presumed. The lowest well-formed
intervertebral disc space is designated L5-S1.

The submitted images demonstrate interbody spacers at the L4-L5 and
L5-S1 levels. Bilateral pedicle screws at L3, L4 and S1. Vertical
interconnecting rods were not present at the time the images were
taken. Overlying retractors.
IMPRESSION: Two intraoperative fluoroscopic images of the lumbar spine, as
described.

## 2022-08-19 DIAGNOSIS — H30033 Focal chorioretinal inflammation, peripheral, bilateral: Secondary | ICD-10-CM

## 2022-08-19 HISTORY — DX: Focal chorioretinal inflammation, peripheral, bilateral: H30.033

## 2023-01-28 ENCOUNTER — Other Ambulatory Visit: Payer: Self-pay | Admitting: Neurosurgery

## 2023-01-31 NOTE — Pre-Procedure Instructions (Signed)
Surgical Instructions    Your procedure is scheduled on February 11, 2023.  Report to [facility] Main Entrance "A" at 9:15 A.M., then check in with the Admitting office.  Call this number if you have problems the morning of surgery:  [phone]  If you have any questions prior to your surgery date call [phone]: Open Monday-Friday 8am-4pm If you experience any cold or flu symptoms such as cough, fever, chills, shortness of breath, etc. between now and your scheduled surgery, please notify us at the above number.     Remember:  Do not eat or drink after midnight the night before your surgery     Take these medicines the morning of surgery with A SIP OF WATER:  amLODipine (NORVASC)   finasteride (PROSCAR)   gabapentin (NEURONTIN)   pantoprazole (PROTONIX)     May take these medicines IF NEEDED:  oxyCODONE-acetaminophen (PERCOCET/ROXICET)     Follow your surgeon's instructions on when to stop Aspirin.  If no instructions were given by your surgeon then you will need to call the office to get those instructions.     As of today, STOP taking any Aleve, Naproxen, Ibuprofen, Motrin, Advil, Goody's, BC's, all herbal medications, fish oil, and all vitamins. This includes your medication: aspirin-acetaminophen-caffeine (EXCEDRIN MIGRAINE)                      Do NOT Smoke (Tobacco/Vaping) for 24 hours prior to your procedure.  If you use a CPAP at night, you may bring your mask/headgear for your overnight stay.   Contacts, glasses, piercing's, hearing aid's, dentures or partials may not be worn into surgery, please bring cases for these belongings.    For patients admitted to the hospital, discharge time will be determined by your treatment team.   Patients discharged the day of surgery will not be allowed to drive home, and someone needs to stay with them for 24 hours.  SURGICAL WAITING ROOM VISITATION Patients having surgery or a procedure may have no more than 2 support people  in the waiting area - these visitors may rotate.   Children under the age of 75 must have an adult with them who is not the patient. If the patient needs to stay at the hospital during part of their recovery, the visitor guidelines for inpatient rooms apply. Pre-op nurse will coordinate an appropriate time for 1 support person to accompany patient in pre-op.  This support person may not rotate.   Please refer to the [facility] website for the visitor guidelines for Inpatients (after your surgery is over and you are in a regular room).   If you received a COVID test during your pre-op visit  it is requested that you wear a mask when out in public, stay away from anyone that may not be feeling well and notify your surgeon if you develop symptoms. If you have been in contact with anyone that has tested positive in the last 10 days please notify you surgeon.    Pre-operative 5 CHG Bath Instructions   You can play a key role in reducing the risk of infection after surgery. Your skin needs to be as free of germs as possible. You can reduce the number of germs on your skin by washing with CHG (chlorhexidine gluconate) soap before surgery. CHG is an antiseptic soap that kills germs and continues to kill germs even after washing.   DO NOT use if you have an allergy to chlorhexidine/CHG or antibacterial soaps.  If your skin becomes reddened or irritated, stop using the CHG and notify one of our RNs at [phone].   Please shower with the CHG soap starting 4 days before surgery using the following schedule:     Please keep in mind the following:  DO NOT shave, including legs and underarms, starting the day of your first shower.   You may shave your face at any point before/day of surgery.  Place clean sheets on your bed the day you start using CHG soap. Use a clean washcloth (not used since being washed) for each shower. DO NOT sleep with pets once you start using the CHG.   CHG Shower  Instructions:  If you choose to wash your hair and private area, wash first with your normal shampoo/soap.  After you use shampoo/soap, rinse your hair and body thoroughly to remove shampoo/soap residue.  Turn the water OFF and apply about 3 tablespoons (45 ml) of CHG soap to a CLEAN washcloth.  Apply CHG soap ONLY FROM YOUR NECK DOWN TO YOUR TOES (washing for 3-5 minutes)  DO NOT use CHG soap on face, private areas, open wounds, or sores.  Pay special attention to the area where your surgery is being performed.  If you are having back surgery, having someone wash your back for you may be helpful. Wait 2 minutes after CHG soap is applied, then you may rinse off the CHG soap.  Pat dry with a clean towel  Put on clean clothes/pajamas   If you choose to wear lotion, please use ONLY the CHG-compatible lotions on the back of this paper.     Additional instructions for the day of surgery: DO NOT APPLY any lotions, deodorants, cologne, or perfumes.   Do not wear jewelry or makeup Do not wear nail polish, gel polish, artificial nails, or any other type of covering on natural nails (fingers and toes) Do not bring valuables to the hospital. [facility] is not responsible for any belongings or valuables. Put on clean/comfortable clothes.  Brush your teeth.  Ask your nurse before applying any prescription medications to the skin.      CHG Compatible Lotions   Aveeno Moisturizing lotion  Cetaphil Moisturizing Cream  Cetaphil Moisturizing Lotion  Clairol Herbal Essence Moisturizing Lotion, Dry Skin  Clairol Herbal Essence Moisturizing Lotion, Extra Dry Skin  Clairol Herbal Essence Moisturizing Lotion, Normal Skin  Curel Age Defying Therapeutic Moisturizing Lotion with Alpha Hydroxy  Curel Extreme Care Body Lotion  Curel Soothing Hands Moisturizing Hand Lotion  Curel Therapeutic Moisturizing Cream, Fragrance-Free  Curel Therapeutic Moisturizing Lotion, Fragrance-Free  Curel Therapeutic  Moisturizing Lotion, Original Formula  Eucerin Daily Replenishing Lotion  Eucerin Dry Skin Therapy Plus Alpha Hydroxy Crme  Eucerin Dry Skin Therapy Plus Alpha Hydroxy Lotion  Eucerin Original Crme  Eucerin Original Lotion  Eucerin Plus Crme Eucerin Plus Lotion  Eucerin TriLipid Replenishing Lotion  Keri Anti-Bacterial Hand Lotion  Keri Deep Conditioning Original Lotion Dry Skin Formula Softly Scented  Keri Deep Conditioning Original Lotion, Fragrance Free Sensitive Skin Formula  Keri Lotion Fast Absorbing Fragrance Free Sensitive Skin Formula  Keri Lotion Fast Absorbing Softly Scented Dry Skin Formula  Keri Original Lotion  Keri Skin Renewal Lotion Keri Silky Smooth Lotion  Keri Silky Smooth Sensitive Skin Lotion  Nivea Body Creamy Conditioning Oil  Nivea Body Extra Enriched Teacher, adult education Moisturizing Lotion Nivea Crme  Nivea Skin Firming Lotion  NutraDerm 30 Skin Lotion  NutraDerm Skin Lotion  NutraDerm Therapeutic Skin Cream  NutraDerm Therapeutic Skin Lotion  ProShield Protective Hand Cream  Provon moisturizing lotion   Please read over the following fact sheets that you were given.

## 2023-02-03 ENCOUNTER — Encounter (HOSPITAL_COMMUNITY)
Admission: RE | Admit: 2023-02-03 | Discharge: 2023-02-03 | Disposition: A | Discharge status: Home / Self Care | Payer: No Typology Code available for payment source | Source: Ambulatory Visit | Attending: Neurosurgery | Admitting: Neurosurgery

## 2023-02-03 ENCOUNTER — Other Ambulatory Visit: Payer: Self-pay

## 2023-02-03 ENCOUNTER — Encounter (HOSPITAL_COMMUNITY): Payer: Self-pay

## 2023-02-03 VITALS — BP 113/84 | HR 88 | Temp 98.2°F | Resp 18 | Ht 69.0 in | Wt 198.3 lb

## 2023-02-03 DIAGNOSIS — K219 Gastro-esophageal reflux disease without esophagitis: Secondary | ICD-10-CM | POA: Diagnosis not present

## 2023-02-03 DIAGNOSIS — M5136 Other intervertebral disc degeneration, lumbar region: Secondary | ICD-10-CM | POA: Diagnosis not present

## 2023-02-03 DIAGNOSIS — D509 Iron deficiency anemia, unspecified: Secondary | ICD-10-CM | POA: Diagnosis not present

## 2023-02-03 DIAGNOSIS — Z01818 Encounter for other preprocedural examination: Secondary | ICD-10-CM | POA: Diagnosis present

## 2023-02-03 DIAGNOSIS — H35039 Hypertensive retinopathy, unspecified eye: Secondary | ICD-10-CM | POA: Insufficient documentation

## 2023-02-03 DIAGNOSIS — I2510 Atherosclerotic heart disease of native coronary artery without angina pectoris: Secondary | ICD-10-CM | POA: Insufficient documentation

## 2023-02-03 DIAGNOSIS — I10 Essential (primary) hypertension: Secondary | ICD-10-CM | POA: Insufficient documentation

## 2023-02-03 DIAGNOSIS — Z981 Arthrodesis status: Secondary | ICD-10-CM | POA: Insufficient documentation

## 2023-02-03 DIAGNOSIS — M47819 Spondylosis without myelopathy or radiculopathy, site unspecified: Secondary | ICD-10-CM | POA: Diagnosis not present

## 2023-02-03 HISTORY — DX: Myoneural disorder, unspecified: G70.9

## 2023-02-03 HISTORY — DX: Unspecified osteoarthritis, unspecified site: M19.90

## 2023-02-03 HISTORY — DX: Bifascicular block: I45.2

## 2023-02-03 LAB — CBC
HCT: 41.2 % (ref 39.0–52.0)
Hemoglobin: 14 g/dL (ref 13.0–17.0)
MCH: 30.2 pg (ref 26.0–34.0)
MCHC: 34 g/dL (ref 30.0–36.0)
MCV: 89 fL (ref 80.0–100.0)
Platelets: 206 10*3/uL (ref 150–400)
RBC: 4.63 MIL/uL (ref 4.22–5.81)
RDW: 12.1 % (ref 11.5–15.5)
WBC: 4.5 10*3/uL (ref 4.0–10.5)
nRBC: 0 % (ref 0.0–0.2)

## 2023-02-03 LAB — BASIC METABOLIC PANEL
Anion gap: 13 (ref 5–15)
BUN: 9 mg/dL (ref 8–23)
CO2: 24 mmol/L (ref 22–32)
Calcium: 9.6 mg/dL (ref 8.9–10.3)
Chloride: 99 mmol/L (ref 98–111)
Creatinine, Ser: 1.18 mg/dL (ref 0.61–1.24)
GFR, Estimated: >60 mL/min (ref >60)
Glucose, Bld: 116 mg/dL — ABNORMAL HIGH (ref 70–99)
Potassium: 4.2 mmol/L (ref 3.5–5.1)
Sodium: 136 mmol/L (ref 135–145)

## 2023-02-03 LAB — TYPE AND SCREEN
ABO/RH(D): A POS
Antibody Screen: NEGATIVE

## 2023-02-03 LAB — SURGICAL PCR SCREEN
MRSA, PCR: NEGATIVE
Staphylococcus aureus: NEGATIVE

## 2023-02-03 NOTE — Progress Notes (Signed)
PCP - Dr. 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 CPAP - n/a  No DM  Last dose of GLP1 agonist- n/a GLP1 instructions: n/a  Blood Thinner Instructions: n/a Aspirin Instructions: Pt was instructed to hold for 7 days. Last dose of ASA was June 16th  NPO after midnight  COVID TEST- n/a   Anesthesia review: Yes. Abnormal EKG review.   Patient denies shortness of breath, fever, cough and chest pain at PAT appointment. Pt denies any respiratory illness/infection in the last two months.   All instructions explained to the patient, with a verbal understanding of the material. Patient agrees to go over the instructions while at home for a better understanding. Patient also instructed to self quarantine after being tested for COVID-19. The opportunity to ask questions was provided.

## 2023-02-04 ENCOUNTER — Encounter (HOSPITAL_COMMUNITY): Payer: Self-pay

## 2023-02-04 NOTE — Progress Notes (Signed)
Anesthesia Chart Review:  Case: [id number] Date/Time: 02/11/23 1100   Procedure: PLIF - L3-L4 (Back)   Anesthesia type: General   Pre-op diagnosis: Stenosis   [facility]: [facility] ROOM 20 / [facility] OR   Surgeons: Julio Sicks, MD       DISCUSSION: Patient is a 67 year old male scheduled for the above procedure.  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 S/p EGD/colonsocpy 03/08/20 showing reflux esophagitis, benign appearing esophageal stenosis s/p dilation, erosive gastropathy with benign biopsy, small hiatal hernia, mild sigmoid diverticulosis, internal hemorrhoids. A1c 5.7% 03/17/21 consistent with pre-diabetes.    He had known bifascicular block since at least 06/15/20 preoperative EKG. He was road biking several times a week then for 6-8 miles then without CV symptoms. He was referred to cardiologist Dr. Rosemary Holms for a preoperative evaluation then.On 06/16/20, Dr. Rosemary Holms wrote, "Excellent baseline functional capacity without any symptoms of angina or angina equivalent.  Normal physical exam.  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  His perioperative cardiac risk is low.  No further testing is necessary.Marland KitchenMarland KitchenI will see him on as-needed basis." Since then he did have an echo on 03/17/21 at [facility] when he was admitted for hyponatremia with pleuritic chest pain. D-dimer and serial troponins negative. Hyponatremia improved with IVF and stopping hydrochlorothiazide. Echo showed normal LV size and wall thickness, LVEF 50-55% with normal wall motion, normal RV systolic function, trace TR.  He previously had reported some mild neck movement limitations  following anterior and posterior cervical fusion, but denied history of difficult intubation or issues with limited mouth opening. MIller and 3 used to place 7.5 ETT on 06/19/20.   Preoperative labs include normal CBC (WBC 4.5 and normal BMET (with non-fasting glucose 116).   Anesthesia team to evaluate on the day of surgery. Last ASA 02/02/23.      A VS: BP 113/84   Pulse 88   Temp 36.8 C (Oral)   Resp 18   Ht 5\' 9"  (1.753 m)   Wt 89.9 kg   SpO2 100%   BMI 29.28 kg/m    PROVIDERS: Avis Epley, MD is PCP with [facility]. Wilfred Curtis, MD is his [facility] PCP with [facility].  - Gae Bon, MD is ophthalmologist ([facility]). Last visit 01/07/23 for follow-up peripheral focal chorioretinal inflammation. By notes, he had imaging on 08/27/22 demonstrating chorioretinal scar OU. He was started on Azathioprine but later held due to low WBC. Ocular exam without signs of active inflammation on 01/07/23. 3 month follow-up planned. He did refer patient to hematology given lymphopenia. Optimization of HTN per PCP recommended for hypertensive retinopathy.  - [name], MD is cardiologist. As needed follow-up recommended at 06/16/20 evaluation.    LABS: Labs reviewed: Acceptable for surgery. (all labs ordered are listed, but only abnormal results are displayed)  Labs Reviewed  BASIC METABOLIC PANEL - Abnormal; Notable for the following components:      Result Value   Glucose, Bld 116 (*)    All other components within normal limits  SURGICAL PCR SCREEN  CBC  TYPE AND SCREEN  A1c 5.7% 02/3021 ([facility]).    IMAGES: MRI T-spine 11/22/22 ([facility]): IMPRESSION:  1. No disc protrusion or spinal stenosis.  2.  Multilevel facet arthrosis with multilevel neural foraminal stenoses, detailed above.  [See full report in Care Everywhere]  MRI L-spine 10/22/22 ([facility]): IMPRESSION: 1. Good appearance in the fusion segment from L4 to the  sacrum. 2. Worsening of adjacent segment degenerative disease at L3-4 with severe multifactorial spinal stenosis that would probably be even worse with standing or flexion. Bulging of the disc. Facet and ligamentous hypertrophy with joint effusions. Neural compression quite likely at this level.   EKG:02/03/23: Sinus rhythm with occasional Premature ventricular complexes Right bundle branch block Left anterior fascicular block ** Bifascicular block ** Minimal voltage criteria for LVH, may be normal variant ( R in aVL ) Septal infarct , age undetermined Abnormal ECG When compared with ECG of 15-Jun-2020 10:22, No significant change since last tracing Confirmed by Alverda Skeans ([id number]) on 02/03/2023 6:07:04 PM - EKG reviewed. Rate is faster, single PVC is new, otherwise no significant change when compared to 06/15/20 tracing.    CV:  Echo 03/17/21 ([facility]; during admission for hyponatremia with pleuritic chest pain, negative D-dimer): - Left ventricle size is normal. Wall thickness is normal. Systolic function is low normal. EF: 50-55%. Wall motion is normal.  - Right ventricle is normal. Systolic function is normal.  - There is trace tricuspid valve regurgitation. Unable to assess RVSP due to incomplete Doppler signal.   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   Spinal stenosis     Past Surgical History:  Procedure Laterality Date   CERVICAL SPINE SURGERY     COLONOSCOPY     EYE SURGERY Bilateral 2004   Lasix   FRACTURE SURGERY Right 1969   Chipped bone repair on knee   JOINT REPLACEMENT     LUMBAR SPINE  SURGERY     REPLACEMENT TOTAL KNEE Right    VASECTOMY  1984    MEDICATIONS:  amLODipine (NORVASC) 5 MG tablet   aspirin EC 81 MG tablet   aspirin-acetaminophen-caffeine (EXCEDRIN MIGRAINE) 250-250-65 MG tablet   ferrous sulfate 325 (65 FE) MG tablet   finasteride (PROSCAR) 5 MG tablet   gabapentin (NEURONTIN) 800 MG tablet   ibuprofen (ADVIL) 200 MG tablet   ibuprofen (ADVIL) 800 MG tablet   lisinopril (ZESTRIL) 20 MG tablet   METAMUCIL FIBER PO   naproxen sodium (ALEVE) 220 MG tablet   OVER THE COUNTER MEDICATION   OVER THE COUNTER MEDICATION   OVER THE COUNTER MEDICATION   oxyCODONE-acetaminophen (PERCOCET/ROXICET) 5-325 MG tablet   pantoprazole (PROTONIX) 40 MG tablet   sildenafil (VIAGRA) 100 MG tablet   tamsulosin (FLOMAX) 0.4 MG CAPS capsule   No current facility-administered medications for this encounter.    Shonna Chock, PA-C Surgical Short Stay/Anesthesiology [facility] Phone [phone] [facility] Phone [phone] 02/04/2023 11:57 AM

## 2023-02-04 NOTE — Anesthesia Preprocedure Evaluation (Addendum)
Anesthesia Evaluation  Patient identified by MRN, date of birth, ID band Patient awake    Reviewed: Allergy & Precautions, NPO status , Patient's Chart, lab work & pertinent test results  History of Anesthesia Complications Negative for: history of anesthetic complications  Airway Mallampati: II  TM Distance: >3 FB Neck ROM: Full    Dental no notable dental hx. (+) Dental Advisory Given   Pulmonary neg pulmonary ROS   Pulmonary exam normal        Cardiovascular Exercise Tolerance: Good hypertension, Pt. on medications Normal cardiovascular exam  He had known bifascicular block since at least 06/15/20 preoperative EKG. He was road biking several times a week then for 6-8 miles then without CV symptoms. He was referred to cardiologist Dr. Rosemary Holms for a preoperative evaluation then.On 06/16/20, Dr. Rosemary Holms wrote, "Excellent baseline functional capacity without any symptoms of angina or angina equivalent.  Normal physical exam.  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  His perioperative cardiac risk is low.  No further testing is necessary.Marland KitchenMarland KitchenI will see him on as-needed basis." Since then he did have an echo on 03/17/21 at [facility] when he was admitted for hyponatremia with pleuritic chest pain. D-dimer and serial troponins negative. Hyponatremia improved with IVF and stopping hydrochlorothiazide. Echo showed normal LV size and wall thickness, LVEF 50-55% with normal wall motion, normal RV systolic function, trace TR.      Neuro/Psych  negative psych ROS   GI/Hepatic Neg liver ROS,GERD  Medicated and Controlled,,  Endo/Other  negative endocrine ROS    Renal/GU negative Renal ROS  negative genitourinary   Musculoskeletal negative musculoskeletal ROS (+)    Abdominal   Peds  Hematology negative hematology ROS (+)   Anesthesia Other Findings    Reproductive/Obstetrics                             Anesthesia Physical Anesthesia Plan  ASA: 2  Anesthesia Plan: General   Post-op Pain Management: Tylenol PO (pre-op)*   Induction: Intravenous  PONV Risk Score and Plan: 3 and Ondansetron, Dexamethasone, Treatment may vary due to age or medical condition and Midazolam  Airway Management Planned: Oral ETT  Additional Equipment: None  Intra-op Plan:   Post-operative Plan: Extubation in OR  Informed Consent: I have reviewed the patients History and Physical, chart, labs and discussed the procedure including the risks, benefits and alternatives for the proposed anesthesia with the patient or authorized representative who has indicated his/her understanding and acceptance.     Dental advisory given  Plan Discussed with: Anesthesiologist and CRNA  Anesthesia Plan Comments: (PAT note written 02/04/2023 by Shonna Chock, PA-C.  )        Anesthesia Quick Evaluation

## 2023-02-11 ENCOUNTER — Ambulatory Visit (HOSPITAL_BASED_OUTPATIENT_CLINIC_OR_DEPARTMENT_OTHER): Payer: No Typology Code available for payment source

## 2023-02-11 ENCOUNTER — Ambulatory Visit (HOSPITAL_COMMUNITY): Payer: No Typology Code available for payment source

## 2023-02-11 ENCOUNTER — Encounter (HOSPITAL_COMMUNITY): Payer: Self-pay | Admitting: Neurosurgery

## 2023-02-11 ENCOUNTER — Other Ambulatory Visit: Payer: Self-pay

## 2023-02-11 ENCOUNTER — Observation Stay (HOSPITAL_COMMUNITY)
Admission: RE | Admit: 2023-02-11 | Discharge: 2023-02-12 | Disposition: A | Discharge status: Home / Self Care | Payer: No Typology Code available for payment source | Attending: Neurosurgery | Admitting: Neurosurgery

## 2023-02-11 ENCOUNTER — Encounter (HOSPITAL_COMMUNITY)
Admission: RE | Disposition: A | Discharge status: Home / Self Care | Payer: Self-pay | Source: Home / Self Care | Attending: Neurosurgery

## 2023-02-11 ENCOUNTER — Ambulatory Visit (HOSPITAL_COMMUNITY): Payer: No Typology Code available for payment source | Admitting: Vascular Surgery

## 2023-02-11 DIAGNOSIS — Z87891 Personal history of nicotine dependence: Secondary | ICD-10-CM | POA: Insufficient documentation

## 2023-02-11 DIAGNOSIS — I10 Essential (primary) hypertension: Secondary | ICD-10-CM | POA: Diagnosis not present

## 2023-02-11 DIAGNOSIS — Z79899 Other long term (current) drug therapy: Secondary | ICD-10-CM | POA: Diagnosis not present

## 2023-02-11 DIAGNOSIS — Z7982 Long term (current) use of aspirin: Secondary | ICD-10-CM | POA: Diagnosis not present

## 2023-02-11 DIAGNOSIS — M48061 Spinal stenosis, lumbar region without neurogenic claudication: Secondary | ICD-10-CM | POA: Diagnosis present

## 2023-02-11 DIAGNOSIS — Z96651 Presence of right artificial knee joint: Secondary | ICD-10-CM | POA: Insufficient documentation

## 2023-02-11 DIAGNOSIS — M5136 Other intervertebral disc degeneration, lumbar region: Secondary | ICD-10-CM

## 2023-02-11 SURGERY — POSTERIOR LUMBAR FUSION 1 LEVEL
Anesthesia: General | Site: Back

## 2023-02-11 MED ORDER — PROPOFOL 10 MG/ML IV BOLUS
INTRAVENOUS | Status: AC
  Filled 2023-02-11: qty 20

## 2023-02-11 MED ORDER — POLYETHYLENE GLYCOL 3350 17 G PO PACK: 17.0000 g | PACK | Freq: Every day | ORAL | Status: DC | PRN

## 2023-02-11 MED ORDER — MIDAZOLAM HCL 2 MG/2ML IJ SOLN
INTRAMUSCULAR | Status: AC
  Filled 2023-02-11: qty 2

## 2023-02-11 MED ORDER — FINASTERIDE 5 MG PO TABS
5.0000 mg | ORAL_TABLET | Freq: Every day | ORAL | Status: DC
  Administered 2023-02-12: 5 mg via ORAL
  Filled 2023-02-11: qty 1

## 2023-02-11 MED ORDER — ORAL CARE MOUTH RINSE: 15.0000 mL | Freq: Once | OROMUCOSAL | Status: AC

## 2023-02-11 MED ORDER — CHLORHEXIDINE GLUCONATE 0.12 % MT SOLN
15.0000 mL | Freq: Once | OROMUCOSAL | Status: AC
  Administered 2023-02-11: 15 mL via OROMUCOSAL
  Filled 2023-02-11: qty 15

## 2023-02-11 MED ORDER — ONDANSETRON HCL 4 MG/2ML IJ SOLN: 4.0000 mg | Freq: Four times a day (QID) | INTRAMUSCULAR | Status: DC | PRN

## 2023-02-11 MED ORDER — SODIUM CHLORIDE 0.9% FLUSH
3.0000 mL | Freq: Two times a day (BID) | INTRAVENOUS | Status: DC
  Administered 2023-02-11 – 2023-02-12 (×2): 3 mL via INTRAVENOUS

## 2023-02-11 MED ORDER — ONDANSETRON HCL 4 MG PO TABS: 4.0000 mg | ORAL_TABLET | Freq: Four times a day (QID) | ORAL | Status: DC | PRN

## 2023-02-11 MED ORDER — SODIUM CHLORIDE 0.9% FLUSH
3.0000 mL | INTRAVENOUS | Status: DC | PRN
  Administered 2023-02-12: 3 mL via INTRAVENOUS

## 2023-02-11 MED ORDER — BUPIVACAINE HCL (PF) 0.25 % IJ SOLN
INTRAMUSCULAR | Status: AC
  Filled 2023-02-11: qty 30

## 2023-02-11 MED ORDER — GABAPENTIN 400 MG PO CAPS
800.0000 mg | ORAL_CAPSULE | Freq: Every day | ORAL | Status: DC
  Administered 2023-02-11 – 2023-02-12 (×4): 800 mg via ORAL
  Filled 2023-02-11 (×4): qty 2

## 2023-02-11 MED ORDER — LISINOPRIL 20 MG PO TABS
20.0000 mg | ORAL_TABLET | Freq: Every day | ORAL | Status: DC
  Administered 2023-02-12: 20 mg via ORAL
  Filled 2023-02-11: qty 1

## 2023-02-11 MED ORDER — LACTATED RINGERS IV SOLN: INTRAVENOUS | Status: DC

## 2023-02-11 MED ORDER — CEFAZOLIN SODIUM-DEXTROSE 1-4 GM/50ML-% IV SOLN
1.0000 g | Freq: Three times a day (TID) | INTRAVENOUS | Status: AC
  Administered 2023-02-11 – 2023-02-12 (×2): 1 g via INTRAVENOUS
  Filled 2023-02-11 (×3): qty 50

## 2023-02-11 MED ORDER — HYDROMORPHONE HCL 1 MG/ML IJ SOLN: 1.0000 mg | INTRAMUSCULAR | Status: DC | PRN

## 2023-02-11 MED ORDER — PROPOFOL 10 MG/ML IV BOLUS
INTRAVENOUS | Status: DC | PRN
  Administered 2023-02-11: 150 mg via INTRAVENOUS

## 2023-02-11 MED ORDER — MENTHOL 3 MG MT LOZG: 1.0000 | LOZENGE | OROMUCOSAL | Status: DC | PRN

## 2023-02-11 MED ORDER — OXYCODONE HCL 5 MG/5ML PO SOLN: 5.0000 mg | Freq: Once | ORAL | Status: AC | PRN

## 2023-02-11 MED ORDER — OXYCODONE HCL 5 MG PO TABS: 5.0000 mg | ORAL_TABLET | Freq: Once | ORAL | Status: AC | PRN

## 2023-02-11 MED ORDER — ROCURONIUM BROMIDE 10 MG/ML (PF) SYRINGE
PREFILLED_SYRINGE | INTRAVENOUS | Status: DC | PRN
  Administered 2023-02-11: 20 mg via INTRAVENOUS
  Administered 2023-02-11: 80 mg via INTRAVENOUS
  Administered 2023-02-11 (×2): 10 mg via INTRAVENOUS

## 2023-02-11 MED ORDER — BISACODYL 10 MG RE SUPP: 10.0000 mg | Freq: Every day | RECTAL | Status: DC | PRN

## 2023-02-11 MED ORDER — ACETAMINOPHEN 325 MG PO TABS: 650.0000 mg | ORAL_TABLET | ORAL | Status: DC | PRN

## 2023-02-11 MED ORDER — VANCOMYCIN HCL 1000 MG IV SOLR
INTRAVENOUS | Status: AC
  Filled 2023-02-11: qty 20

## 2023-02-11 MED ORDER — ASPIRIN 81 MG PO TBEC
81.0000 mg | DELAYED_RELEASE_TABLET | Freq: Every day | ORAL | Status: DC
  Administered 2023-02-12: 81 mg via ORAL
  Filled 2023-02-11: qty 1

## 2023-02-11 MED ORDER — LIDOCAINE 2% (20 MG/ML) 5 ML SYRINGE
INTRAMUSCULAR | Status: DC | PRN
  Administered 2023-02-11: 100 mg via INTRAVENOUS

## 2023-02-11 MED ORDER — OXYCODONE HCL 5 MG PO TABS
ORAL_TABLET | ORAL | Status: AC
  Administered 2023-02-11: 5 mg via ORAL
  Filled 2023-02-11: qty 1

## 2023-02-11 MED ORDER — CHLORHEXIDINE GLUCONATE CLOTH 2 % EX PADS: 6.0000 | MEDICATED_PAD | Freq: Once | CUTANEOUS | Status: DC

## 2023-02-11 MED ORDER — ROCURONIUM BROMIDE 10 MG/ML (PF) SYRINGE
PREFILLED_SYRINGE | INTRAVENOUS | Status: AC
  Filled 2023-02-11: qty 10

## 2023-02-11 MED ORDER — VANCOMYCIN HCL 1000 MG IV SOLR
INTRAVENOUS | Status: DC | PRN
  Administered 2023-02-11: 1000 mg

## 2023-02-11 MED ORDER — AMISULPRIDE (ANTIEMETIC) 5 MG/2ML IV SOLN: 10.0000 mg | Freq: Once | INTRAVENOUS | Status: DC | PRN

## 2023-02-11 MED ORDER — DIAZEPAM 5 MG PO TABS: 5.0000 mg | ORAL_TABLET | Freq: Four times a day (QID) | ORAL | Status: DC | PRN

## 2023-02-11 MED ORDER — ACETAMINOPHEN 650 MG RE SUPP: 650.0000 mg | RECTAL | Status: DC | PRN

## 2023-02-11 MED ORDER — SODIUM CHLORIDE 0.9 % IV SOLN
250.0000 mL | INTRAVENOUS | Status: DC
  Administered 2023-02-11: 250 mL via INTRAVENOUS

## 2023-02-11 MED ORDER — PHENYLEPHRINE HCL-NACL 20-0.9 MG/250ML-% IV SOLN
INTRAVENOUS | Status: DC | PRN
  Administered 2023-02-11: 30 ug/min via INTRAVENOUS

## 2023-02-11 MED ORDER — PHENOL 1.4 % MT LIQD: 1.0000 | OROMUCOSAL | Status: DC | PRN

## 2023-02-11 MED ORDER — PANTOPRAZOLE SODIUM 40 MG PO TBEC
40.0000 mg | DELAYED_RELEASE_TABLET | Freq: Every day | ORAL | Status: DC
  Administered 2023-02-12: 40 mg via ORAL
  Filled 2023-02-11: qty 1

## 2023-02-11 MED ORDER — ONDANSETRON HCL 4 MG/2ML IJ SOLN
INTRAMUSCULAR | Status: DC | PRN
  Administered 2023-02-11: 4 mg via INTRAVENOUS

## 2023-02-11 MED ORDER — LIDOCAINE 2% (20 MG/ML) 5 ML SYRINGE
INTRAMUSCULAR | Status: AC
  Filled 2023-02-11: qty 5

## 2023-02-11 MED ORDER — DEXAMETHASONE SODIUM PHOSPHATE 10 MG/ML IJ SOLN
INTRAMUSCULAR | Status: DC | PRN
  Administered 2023-02-11: 10 mg via INTRAVENOUS

## 2023-02-11 MED ORDER — FENTANYL CITRATE (PF) 250 MCG/5ML IJ SOLN
INTRAMUSCULAR | Status: DC | PRN
  Administered 2023-02-11: 100 ug via INTRAVENOUS
  Administered 2023-02-11: 25 ug via INTRAVENOUS

## 2023-02-11 MED ORDER — CEFAZOLIN SODIUM-DEXTROSE 2-4 GM/100ML-% IV SOLN
2.0000 g | INTRAVENOUS | Status: AC
  Administered 2023-02-11: 2 g via INTRAVENOUS
  Filled 2023-02-11: qty 100

## 2023-02-11 MED ORDER — 0.9 % SODIUM CHLORIDE (POUR BTL) OPTIME
TOPICAL | Status: DC | PRN
  Administered 2023-02-11: 1000 mL

## 2023-02-11 MED ORDER — THROMBIN 20000 UNITS EX SOLR
CUTANEOUS | Status: DC | PRN
  Administered 2023-02-11: 20 mL via TOPICAL

## 2023-02-11 MED ORDER — SUGAMMADEX SODIUM 200 MG/2ML IV SOLN
INTRAVENOUS | Status: DC | PRN
  Administered 2023-02-11: 177 mg via INTRAVENOUS

## 2023-02-11 MED ORDER — FENTANYL CITRATE (PF) 100 MCG/2ML IJ SOLN
INTRAMUSCULAR | Status: AC
  Filled 2023-02-11: qty 2

## 2023-02-11 MED ORDER — ACETAMINOPHEN 10 MG/ML IV SOLN
INTRAVENOUS | Status: AC
  Filled 2023-02-11: qty 100

## 2023-02-11 MED ORDER — MIDAZOLAM HCL 2 MG/2ML IJ SOLN
INTRAMUSCULAR | Status: DC | PRN
  Administered 2023-02-11: 2 mg via INTRAVENOUS

## 2023-02-11 MED ORDER — TAMSULOSIN HCL 0.4 MG PO CAPS
0.4000 mg | ORAL_CAPSULE | Freq: Every day | ORAL | Status: DC
  Administered 2023-02-11: 0.4 mg via ORAL
  Filled 2023-02-11: qty 1

## 2023-02-11 MED ORDER — PHENYLEPHRINE 80 MCG/ML (10ML) SYRINGE FOR IV PUSH (FOR BLOOD PRESSURE SUPPORT)
PREFILLED_SYRINGE | INTRAVENOUS | Status: DC | PRN
  Administered 2023-02-11: 80 ug via INTRAVENOUS
  Administered 2023-02-11: 160 ug via INTRAVENOUS
  Administered 2023-02-11 (×2): 80 ug via INTRAVENOUS

## 2023-02-11 MED ORDER — FENTANYL CITRATE (PF) 250 MCG/5ML IJ SOLN
INTRAMUSCULAR | Status: AC
  Filled 2023-02-11: qty 5

## 2023-02-11 MED ORDER — CELECOXIB 200 MG PO CAPS
200.0000 mg | ORAL_CAPSULE | Freq: Once | ORAL | Status: AC
  Administered 2023-02-11: 200 mg via ORAL
  Filled 2023-02-11: qty 1

## 2023-02-11 MED ORDER — BUPIVACAINE HCL (PF) 0.25 % IJ SOLN
INTRAMUSCULAR | Status: DC | PRN
  Administered 2023-02-11: 20 mL

## 2023-02-11 MED ORDER — HYDROCODONE-ACETAMINOPHEN 10-325 MG PO TABS
1.0000 | ORAL_TABLET | ORAL | Status: DC | PRN
  Administered 2023-02-12: 1 via ORAL
  Filled 2023-02-11: qty 1

## 2023-02-11 MED ORDER — ONDANSETRON HCL 4 MG/2ML IJ SOLN
INTRAMUSCULAR | Status: AC
  Filled 2023-02-11: qty 2

## 2023-02-11 MED ORDER — FENTANYL CITRATE (PF) 100 MCG/2ML IJ SOLN
25.0000 ug | INTRAMUSCULAR | Status: DC | PRN
  Administered 2023-02-11 (×5): 25 ug via INTRAVENOUS

## 2023-02-11 MED ORDER — THROMBIN 20000 UNITS EX SOLR
CUTANEOUS | Status: AC
  Filled 2023-02-11: qty 20000

## 2023-02-11 MED ORDER — FERROUS SULFATE 325 (65 FE) MG PO TABS
325.0000 mg | ORAL_TABLET | Freq: Every day | ORAL | Status: DC
  Administered 2023-02-12: 325 mg via ORAL
  Filled 2023-02-11: qty 1

## 2023-02-11 MED ORDER — OXYCODONE HCL 5 MG PO TABS
10.0000 mg | ORAL_TABLET | ORAL | Status: DC | PRN
  Administered 2023-02-11 – 2023-02-12 (×4): 10 mg via ORAL
  Filled 2023-02-11 (×4): qty 2

## 2023-02-11 MED ORDER — FLEET ENEMA 7-19 GM/118ML RE ENEM: 1.0000 | ENEMA | Freq: Once | RECTAL | Status: DC | PRN

## 2023-02-11 MED ORDER — DEXAMETHASONE SODIUM PHOSPHATE 10 MG/ML IJ SOLN
INTRAMUSCULAR | Status: AC
  Filled 2023-02-11: qty 1

## 2023-02-11 MED ORDER — ACETAMINOPHEN 10 MG/ML IV SOLN
1000.0000 mg | Freq: Once | INTRAVENOUS | Status: DC | PRN
  Administered 2023-02-11: 1000 mg via INTRAVENOUS

## 2023-02-11 MED ORDER — AMLODIPINE BESYLATE 5 MG PO TABS
5.0000 mg | ORAL_TABLET | Freq: Every day | ORAL | Status: DC
  Administered 2023-02-12: 5 mg via ORAL
  Filled 2023-02-11: qty 1

## 2023-02-11 SURGICAL SUPPLY — 66 items
ADH SKN CLS APL DERMABOND .7 (GAUZE/BANDAGES/DRESSINGS) ×1
APL SKNCLS NONHYPOALLERGENIC (GAUZE/BANDAGES/DRESSINGS) ×1
APL SKNCLS STERI-STRIP NONHPOA (GAUZE/BANDAGES/DRESSINGS) ×1
BAG COUNTER SPONGE SURGICOUNT (BAG) ×1 IMPLANT
BAG DECANTER FOR FLEXI CONT (MISCELLANEOUS) ×1 IMPLANT
BAG SPNG CNTER NS LX DISP (BAG) ×1
BENZOIN TINCTURE PRP APPL 2/3 (GAUZE/BANDAGES/DRESSINGS) ×1 IMPLANT
BLADE BONE MILL MEDIUM (MISCELLANEOUS) ×1 IMPLANT
BLADE CLIPPER SURG (BLADE) IMPLANT
BUR CUTTER 7.0 ROUND (BURR) ×1 IMPLANT
BUR MATCHSTICK NEURO 3.0 LAGG (BURR) ×1 IMPLANT
CAGE EXP CATALYFT 9 (Plate) IMPLANT
CANISTER SUCT 3000ML PPV (MISCELLANEOUS) ×1 IMPLANT
CAP LCK SPNE (Orthopedic Implant) ×2 IMPLANT
CAP LOCK SPINE RADIUS (Orthopedic Implant) IMPLANT
CAP LOCKING (Orthopedic Implant) ×2 IMPLANT
CNTNR URN SCR LID CUP LEK RST (MISCELLANEOUS) ×1 IMPLANT
CONT SPEC 4OZ STRL OR WHT (MISCELLANEOUS) ×1
COVER BACK TABLE 60X90IN (DRAPES) ×1 IMPLANT
DERMABOND ADVANCED .7 DNX12 (GAUZE/BANDAGES/DRESSINGS) ×1 IMPLANT
DRAPE C-ARM 42X72 X-RAY (DRAPES) ×2 IMPLANT
DRAPE HALF SHEET 40X57 (DRAPES) IMPLANT
DRAPE LAPAROTOMY 100X72X124 (DRAPES) ×1 IMPLANT
DRAPE SURG 17X23 STRL (DRAPES) ×4 IMPLANT
DRSG OPSITE POSTOP 4X6 (GAUZE/BANDAGES/DRESSINGS) ×1 IMPLANT
DURAPREP 26ML APPLICATOR (WOUND CARE) ×1 IMPLANT
ELECT REM PT RETURN 9FT ADLT (ELECTROSURGICAL) ×1
ELECTRODE REM PT RTRN 9FT ADLT (ELECTROSURGICAL) ×1 IMPLANT
EVACUATOR 1/8 PVC DRAIN (DRAIN) IMPLANT
GAUZE 4X4 16PLY ~~LOC~~+RFID DBL (SPONGE) IMPLANT
GAUZE SPONGE 4X4 12PLY STRL (GAUZE/BANDAGES/DRESSINGS) IMPLANT
GLOVE BIO SURGEON STRL SZ 6.5 (GLOVE) ×1 IMPLANT
GLOVE BIOGEL PI IND STRL 6.5 (GLOVE) ×1 IMPLANT
GLOVE ECLIPSE 9.0 STRL (GLOVE) ×2 IMPLANT
GLOVE EXAM NITRILE XL STR (GLOVE) IMPLANT
GOWN STRL REUS W/ TWL LRG LVL3 (GOWN DISPOSABLE) IMPLANT
GOWN STRL REUS W/ TWL XL LVL3 (GOWN DISPOSABLE) ×2 IMPLANT
GOWN STRL REUS W/TWL 2XL LVL3 (GOWN DISPOSABLE) IMPLANT
GOWN STRL REUS W/TWL LRG LVL3 (GOWN DISPOSABLE)
GOWN STRL REUS W/TWL XL LVL3 (GOWN DISPOSABLE) ×2
KIT BASIN OR (CUSTOM PROCEDURE TRAY) ×1 IMPLANT
KIT TURNOVER KIT B (KITS) ×1 IMPLANT
NDL HYPO 22X1.5 SAFETY MO (MISCELLANEOUS) ×1 IMPLANT
NEEDLE HYPO 22X1.5 SAFETY MO (MISCELLANEOUS) ×1 IMPLANT
NS IRRIG 1000ML POUR BTL (IV SOLUTION) ×1 IMPLANT
PACK LAMINECTOMY NEURO (CUSTOM PROCEDURE TRAY) ×1 IMPLANT
PUTTY GRAFTON DBF 6CC W/DELIVE (Putty) IMPLANT
RASP 3.0MM (RASP) IMPLANT
ROD RADIUS 45MM (Rod) ×2 IMPLANT
ROD SPNL 45X5.5XNS TI RDS (Rod) IMPLANT
SCREW POLYAXIAL 6.5X45MM (Screw) IMPLANT
SET SCREW (Screw) ×2 IMPLANT
SET SCREW VRST (Screw) IMPLANT
SOL ELECTROSURG ANTI STICK (MISCELLANEOUS) ×1
SOLUTION ELECTROSURG ANTI STCK (MISCELLANEOUS) ×1 IMPLANT
SPIKE FLUID TRANSFER (MISCELLANEOUS) ×1 IMPLANT
SPONGE SURGIFOAM ABS GEL 100 (HEMOSTASIS) ×1 IMPLANT
STRIP CLOSURE SKIN 1/2X4 (GAUZE/BANDAGES/DRESSINGS) ×2 IMPLANT
SUT VIC AB 0 CT1 18XCR BRD8 (SUTURE) ×2 IMPLANT
SUT VIC AB 0 CT1 8-18 (SUTURE) ×2
SUT VIC AB 2-0 CT1 18 (SUTURE) ×1 IMPLANT
SUT VIC AB 3-0 SH 8-18 (SUTURE) ×2 IMPLANT
TOWEL GREEN STERILE (TOWEL DISPOSABLE) ×1 IMPLANT
TOWEL GREEN STERILE FF (TOWEL DISPOSABLE) ×1 IMPLANT
TRAY FOLEY MTR SLVR 16FR STAT (SET/KITS/TRAYS/PACK) ×1 IMPLANT
WATER STERILE IRR 1000ML POUR (IV SOLUTION) ×1 IMPLANT

## 2023-02-11 NOTE — Transfer of Care (Signed)
Immediate Anesthesia Transfer of Care Note  Patient: Jonathon Zimmerman  Procedure(s) Performed: Posterior Lumbar Interbody Fusion Lumbar Three-Lumbar Four (Back)  Patient Location: PACU  Anesthesia Type:General  Level of Consciousness: awake, alert , and oriented  Airway & Oxygen Therapy: Patient Spontanous Breathing and Patient connected to face mask oxygen  Post-op Assessment: Report given to RN, Post -op Vital signs reviewed and stable, Patient moving all extremities X 4, and Patient able to stick tongue midline  Post vital signs: Reviewed  Last Vitals:  Vitals Value Taken Time  BP 126/88   Temp 97.8   Pulse 71   Resp 20   SpO2 100     Last Pain:  Vitals:   02/11/23 0924  TempSrc: Oral         Complications: No notable events documented.

## 2023-02-11 NOTE — Brief Op Note (Signed)
02/11/2023  1:51 PM  PATIENT:  Jonathon Zimmerman  67 y.o. male  PRE-OPERATIVE DIAGNOSIS:  Stenosis  POST-OPERATIVE DIAGNOSIS:  Stenosis  PROCEDURE:  Procedure(s): Posterior Lumbar Interbody Fusion Lumbar Three-Lumbar Four (N/A)  SURGEON:  Surgeon(s) and Role:    * Julio Sicks, MD - Primary  PHYSICIAN ASSISTANT:   ASSISTANTSMarland Mcalpine   ANESTHESIA:   general  EBL:  100 mL   BLOOD ADMINISTERED:none  DRAINS: none   LOCAL MEDICATIONS USED:  MARCAINE     SPECIMEN:  No Specimen  DISPOSITION OF SPECIMEN:  N/A  COUNTS:  YES  TOURNIQUET:  * No tourniquets in log *  DICTATION: .Dragon Dictation  PLAN OF CARE: Admit for overnight observation  PATIENT DISPOSITION:  PACU - hemodynamically stable.   Delay start of Pharmacological VTE agent (>24hrs) due to surgical blood loss or risk of bleeding: yes

## 2023-02-11 NOTE — H&P (Signed)
Jonathon Zimmerman is an 67 y.o. male.   Chief Complaint: Back pain HPI: 67 year old male remotely status post L4-5 and L5-S1 decompression and fusion presents now with worsening back and bilateral lower extremity pain numbness and weakness.  Workup demonstrates evidence of adjacent level degeneration with significant stenosis at L3-4.  Patient has failed conservative management and presents now for decompression and fusion L3-4.  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 He has quit using smokeless tobacco.  His smokeless tobacco use included chew. He reports current alcohol use. He reports that he does not use drugs.  Allergies:  Allergies  Allergen Reactions   Oxcarbazepine     Hyponatremia    Medications Prior to Admission  Medication Sig Dispense Refill   amLODipine (NORVASC) 5 MG tablet Take 5 mg by mouth daily.     aspirin EC 81 MG tablet Take 81 mg by mouth daily. Swallow whole.     aspirin-acetaminophen-caffeine (EXCEDRIN MIGRAINE) 250-250-65 MG tablet Take 2 tablets by mouth every 6 (six) hours as needed for headache.     ferrous sulfate 325 (65 FE) MG tablet Take 325 mg by mouth daily.     finasteride (PROSCAR) 5 MG tablet Take 5 mg by mouth  daily.     gabapentin (NEURONTIN) 800 MG tablet Take 800 mg by mouth 5 (five) times daily.     ibuprofen (ADVIL) 200 MG tablet Take 400 mg by mouth every 6 (six) hours as needed for headache.     ibuprofen (ADVIL) 800 MG tablet Take 800 mg by mouth every 8 (eight) hours as needed for moderate pain.     lisinopril (ZESTRIL) 20 MG tablet Take 20 mg by mouth daily.     METAMUCIL FIBER PO Take 1 Scoop by mouth daily.     naproxen sodium (ALEVE) 220 MG tablet Take 440 mg by mouth daily as needed (pain).     OVER THE COUNTER MEDICATION Take 1 packet by mouth in the morning, at noon, and at bedtime. Relief Factor supplement     OVER THE COUNTER MEDICATION Take 3 capsules by mouth daily. Balance of nature fruit     OVER THE COUNTER MEDICATION Take 3 capsules by mouth daily. Balance of nature vegetables     oxyCODONE-acetaminophen (PERCOCET/ROXICET) 5-325 MG tablet Take 1 tablet by mouth every 6 (six) hours as needed for severe pain.     pantoprazole (PROTONIX) 40 MG tablet Take 40 mg by mouth daily.      sildenafil (VIAGRA) 100 MG tablet Take 100 mg by mouth daily as needed for erectile  dysfunction.      tamsulosin (FLOMAX) 0.4 MG CAPS capsule Take 0.4 mg by mouth at bedtime.      No results found for this or any previous visit (from the past 48 hour(s)). No results found.  Pertinent items noted in HPI and remainder of comprehensive ROS otherwise negative.  Blood pressure (!) 137/93, pulse 83, temperature 97.8 F (36.6 C), temperature source Oral, resp. rate 18, height 5\' 9"  (1.753 m), weight 88.5 kg, SpO2 100 %.  Patient is awake and alert.  He is oriented and appropriate.  Speech is fluent.  Judgment insight are intact.  Cranial nerve function normal bilaterally motor examination reveals decreased sensation bilaterally in his L4 dermatomes.  He has some mild quadriceps weakness otherwise motor and sensory function are intact.  Reflexes are hypoactive but symmetric.  Gait is antalgic.  Posture is  mildly flexed.  Examination head ears eyes nose throat is unremarked.  Chest and abdomen are benign.  Extremities are free from injury deformity. Assessment/Plan L3-4 adjacent segment degeneration with stenosis.  Plan bilateral L3-4 decompressive laminotomies with foraminotomies followed by posterior lumbar interbody fusion utilizing interbody cages, local harvested autograft, and augmented with posterior arthrodesis utilizing nonsegmental pedicle screw fixation and local autografting.  Risks and benefits been explained.  Patient wishes to proceed.  Sherilyn Cooter A [name] 02/11/2023, 10:59 AM

## 2023-02-11 NOTE — Anesthesia Procedure Notes (Addendum)
Procedure Name: Intubation Date/Time: 02/11/2023 11:39 AM  Performed by: Cy Blamer, CRNAPre-anesthesia Checklist: Patient identified, Emergency Drugs available, Suction available and Patient being monitored Patient Re-evaluated:Patient Re-evaluated prior to induction Oxygen Delivery Method: Circle system utilized Preoxygenation: Pre-oxygenation with 100% oxygen Induction Type: IV induction Ventilation: Mask ventilation without difficulty Laryngoscope Size: Miller and 2 Grade View: Grade I Tube type: Oral Tube size: 7.5 mm Number of attempts: 1 Airway Equipment and Method: Stylet and Oral airway Placement Confirmation: ETT inserted through vocal cords under direct vision, positive ETCO2 and breath sounds checked- equal and bilateral Secured at: 23 cm Tube secured with: Tape Dental Injury: Teeth and Oropharynx as per pre-operative assessment  Comments: Intubation performed by Deniece Ree, SRNA

## 2023-02-11 NOTE — Plan of Care (Signed)

## 2023-02-11 NOTE — Op Note (Signed)
Date of procedure: 02/11/2023  Date of dictation: Same  Service: Neurosurgery  Preoperative diagnosis: L3-4 stenosis at adjacent segment of prior L4-S1 decompression and fusion.  Postoperative  diagnosis: Same  Procedure Name: Bilateral L3-4 decompressive laminotomies and foraminotomies, more than would be required for simple interbody fusion alone.  L3-4 posterior lumbar interbody fusion utilizing interbody cages, locally harvested autograft, and morselized allograft  L3-4 posterior lateral arthrodesis utilizing nonsegmental pedicle screw fixation and local autograft  Surgeon:Blasa Raisch A.Solana Coggin, M.D.  Asst. Surgeon: Doran Durand, NP  Anesthesia: General  Indication: 67 year old male remotely status post L4-S1 decompression and fusion presents with back and bilateral lower extremity symptoms failing conservative manage.  Workup demonstrates evidence of severe adjacent level degeneration with marked stenosis.  Patient has failed conservative management and presents now for decompression and fusion L3-4.  Operative note: After induction of anesthesia, patient positioned prone on the Wilson frame and properly padded.  Lumbar region prepped and draped sterilely.  Incision made overlying L3-4-5.  Dissection performed bilaterally.  Retractor placed.  Fluoroscopy used.  Levels confirmed.  Previously placed pedicle screw position L4 and L5 were identified.  The S1 screws were not visualized.  The fusion appeared to be mature at L4-5.  I decided to simply cut the rods beneath the L4 screw heads and remove the rod segment.  The rods were cut using a titanium cutting build.  The caps were removed.  The screws at L4 were stable and solid position.  The excess fusion at L4-5 was inspected and found to be solid.  I felt comfortable not bridging instrumentation over the segment.  Attention then placed to L3-4.  Decompressive laminotomies andFacetectomy was then performed using Leksell rongeurs, Kerrison rongeurs and  the high-speed drill.  The inferior two thirds of the lamina of L3 was resected bilaterally.  The inferior aspect of the spinous process of L3 and interspinous ligament between L3 and L4 were resected.  The entire inferior facet joint complex and pars interarticularis of L3 were resected bilaterally.  The majority of the superior articulating process of L4 was resected.  The superior aspect the L4 lamina was resected.  Ligament flavum elevated and resected.  Foraminotomies completed on the course exiting L3 and L4 nerve roots bilaterally.  Bilateral discectomy was then performed at L3-4.  Disc base then prepared for interbody fusion.  With the distractor placed patient's right side disc base was further cleaned of soft tissue.  A 9 mm Medtronic expandable cage was then impacted in place on the left side and expanded.  Distractor removed patient's right side.  Disc base prepared the right side.  Morselized autograft was then packed in the interspace.  Second cage was then packed into place and expanded.  Pedicles of L3 were identified using surface landmarks and intraoperative fluoroscopy.  Superficial bone overlying the pedicle was then removed using a high-speed drill.  Pedicle was then probed using a pedicle awl.  Pedicle tract was then probed and found to be solidly within the bone.  Each pedicle tract was then tapped with a screw tap.  Screw hole was probed and found to be solidly within the bone.  6.5 mm Everest screws from [employer] were placed bilaterally at L3.  Final images revealed good position cages and the hardware at the proper level with normal alignment of spine.  Each cage was then packed with graft on putty.  Short segment of titanium rod placed over the screw heads at L3 and L4.  Locking caps placed over the  screws.  Locking caps then engaged with the construct under compression.  Transverse processes of L3 and L4 were decorticated.  Morselized autograft packed posterolaterally.  Gelfoam was  placed over the laminotomy defects.  Vancomycin powder placed in deep wound space.  Wounds and closed in layers with Vicryl sutures.  Steri-Strips and sterile dressing were applied.  No apparent complications.  Patient tolerated the procedure well and he returns to the recovery room postop.

## 2023-02-11 NOTE — Progress Notes (Signed)
Orthopedic Tech Progress Note Patient Details:  Jonathon Zimmerman 08-Jun-1956 [id number]  Ortho Devices Type of Ortho Device: Lumbar corsett Ortho Device/Splint Location: BACK Ortho Device/Splint Interventions: Ordered   Post Interventions Patient Tolerated: Well Instructions Provided: Care of device  [name] 02/11/2023, 5:21 PM

## 2023-02-11 NOTE — Anesthesia Postprocedure Evaluation (Signed)
Anesthesia Post Note  Patient: Jonathon Zimmerman  Procedure(s) Performed: Posterior Lumbar Interbody Fusion Lumbar Three-Lumbar Four (Back)     Patient location during evaluation: PACU Anesthesia Type: General Level of consciousness: sedated Pain management: pain level controlled Vital Signs Assessment: post-procedure vital signs reviewed and stable Respiratory status: spontaneous breathing and respiratory function stable Cardiovascular status: stable Postop Assessment: no apparent nausea or vomiting Anesthetic complications: no  No notable events documented.  Last Vitals:  Vitals:   02/11/23 1445 02/11/23 1500  BP: 114/84 97/81  Pulse: 63 64  Resp: 12 12  Temp:  36.6 C  SpO2: 97% 98%    Last Pain:  Vitals:   02/11/23 1445  TempSrc:   PainSc: 5                  Emmagene Ortner DANIEL

## 2023-02-12 DIAGNOSIS — M48061 Spinal stenosis, lumbar region without neurogenic claudication: Secondary | ICD-10-CM | POA: Diagnosis not present

## 2023-02-12 MED ORDER — OXYCODONE HCL 10 MG PO TABS: 10.0000 mg | ORAL_TABLET | ORAL | 0 refills | Status: AC | PRN

## 2023-02-12 MED ORDER — METHOCARBAMOL 750 MG PO TABS: 750.0000 mg | ORAL_TABLET | Freq: Four times a day (QID) | ORAL | 1 refills | Status: AC | PRN

## 2023-02-12 NOTE — Evaluation (Signed)
Occupational Therapy Evaluation Patient Details Name: Jonathon Zimmerman MRN: [id number] DOB: 06-08-1956 Today's Date: 02/12/2023   History of Present Illness Pt is a 67 y.o. male s/p PLIF L3-4 on 02/11/2023. PMH significant for L4-S1 PLIF, HTN, R total knee replacement, cervical spinal surgery.   Clinical Impression   PTA, pt lived with his wife and was independent. Upon eval, pt performing UB ADL with set-up and LB ADL with supervision. Pt educated and demonstrating use of compensatory techniques for bed mobility, LB ADL, grooming, toileting, and shower transfers within precautions. All education provided and questions answered. Recommending discharge home with no OT follow up. OT to sign off. Please re-consult if change in status.        Recommendations for follow up therapy are one component of a multi-disciplinary discharge planning process, led by the attending physician.  Recommendations may be updated based on patient status, additional functional criteria and insurance authorization.   Assistance Recommended at Discharge Intermittent Supervision/Assistance  Patient can return home with the following Help with stairs or ramp for entrance;Assist for transportation;Assistance with cooking/housework    Functional Status Assessment  Patient has had a recent decline in their functional status and demonstrates the ability to make significant improvements in function in a reasonable and predictable amount of time.  Equipment Recommendations  None recommended by OT    Recommendations for Other Services       Precautions / Restrictions Precautions Precautions: Back;Fall Precaution Booklet Issued: Yes (comment) Precaution Comments: Educated on 3/3 back precautions within the context of ADL. Handout provided Required Braces or Orthoses: Spinal Brace Spinal Brace: Applied in sitting position;Lumbar corset      Mobility Bed Mobility               General bed mobility comments:  in recliner on arrival and departure. Pt able to verbalize log roll and how he performed it with PT    Transfers Overall transfer level: Needs assistance Equipment used: Rolling walker (2 wheels) Transfers: Sit to/from Stand Sit to Stand: Supervision           General transfer comment: for safety      Balance Overall balance assessment: Needs assistance Sitting-balance support: No upper extremity supported, Feet supported Sitting balance-Leahy Scale: Good     Standing balance support: Bilateral upper extremity supported, No upper extremity supported, During functional activity Standing balance-Leahy Scale: Poor Standing balance comment: reliant on external support                           ADL either performed or assessed with clinical judgement   ADL Overall ADL's : Needs assistance/impaired Eating/Feeding: Modified independent;Sitting Eating/Feeding Details (indicate cue type and reason): finishing breakfast on arrival Grooming: Wash/dry hands;Oral care;Standing;Supervision/safety Grooming Details (indicate cue type and reason): good use of compensatory techniques after education Upper Body Bathing: Set up;Sitting;Standing;Supervision/ safety   Lower Body Bathing: Supervison/ safety;Sit to/from stand   Upper Body Dressing : Set up;Sitting;Standing;Supervision/safety Upper Body Dressing Details (indicate cue type and reason): Able to don brace without physical assist Lower Body Dressing: Supervision/safety;Sitting/lateral leans;Sit to/from stand Lower Body Dressing Details (indicate cue type and reason): good use of compensatory techniques Toilet Transfer: Supervision/safety;Rolling walker (2 wheels);Ambulation;Regular Toilet;Grab bars   Toileting- Clothing Manipulation and Hygiene: Supervision/safety;Sitting/lateral lean   Tub/ Shower Transfer: Walk-in shower;Min guard;Ambulation;Rolling walker (2 wheels);Shower seat   Functional mobility during ADLs:  Supervision/safety;Rolling walker (2 wheels)       Vision Baseline Vision/History: 0  No visual deficits Ability to See in Adequate Light: 0 Adequate Patient Visual Report: No change from baseline Vision Assessment?: No apparent visual deficits     Perception     Praxis      Pertinent Vitals/Pain Pain Assessment Pain Assessment: 0-10 Pain Score: 6  Pain Location: back Pain Descriptors / Indicators: Discomfort, Grimacing, Sore Pain Intervention(s): Limited activity within patient's tolerance, Monitored during session     Hand Dominance Right   Extremity/Trunk Assessment Upper Extremity Assessment Upper Extremity Assessment: Overall WFL for tasks assessed   Lower Extremity Assessment Lower Extremity Assessment: Defer to PT evaluation   Cervical / Trunk Assessment Cervical / Trunk Assessment: Back Surgery   Communication Communication Communication: No difficulties   Cognition Arousal/Alertness: Awake/alert Behavior During Therapy: WFL for tasks assessed/performed Overall Cognitive Status: Within Functional Limits for tasks assessed                                 General Comments: recalling all spinal precautions and receptive to education provided     General Comments  VSS    Exercises     Shoulder Instructions      Home Living Family/patient expects to be discharged to:: Private residence Living Arrangements: Spouse/significant other;Children Available Help at Discharge: Family;Available 24 hours/day Type of Home: House Home Access: Stairs to enter [employer] of Steps: 3 Entrance Stairs-Rails: Left Home Layout: Able to live on main level with bedroom/bathroom;Two level     Bathroom Shower/Tub: Producer, television/film/video: Standard     Home Equipment: Shower seat - built Charity fundraiser (2 wheels);Cane - single point          Prior Functioning/Environment Prior Level of Function : Independent/Modified  Independent;Driving             Mobility Comments: amb with cane vs no AD ADLs Comments: Pt reports no assist needed        OT Problem List: Decreased strength;Decreased activity tolerance;Impaired balance (sitting and/or standing);Decreased knowledge of use of DME or AE;Decreased knowledge of precautions      OT Treatment/Interventions:      OT Goals(Current goals can be found in the care plan section) Acute Rehab OT Goals Patient Stated Goal: get better OT Goal Formulation: With patient  OT Frequency:      Co-evaluation              AM-PAC OT "6 Clicks" Daily Activity     Outcome Measure Help from another person eating meals?: None Help from another person taking care of personal grooming?: A Little Help from another person toileting, which includes using toliet, bedpan, or urinal?: A Little Help from another person bathing (including washing, rinsing, drying)?: A Little Help from another person to put on and taking off regular upper body clothing?: A Little Help from another person to put on and taking off regular lower body clothing?: A Little 6 Click Score: 19   End of Session Equipment Utilized During Treatment: Gait belt;Rolling walker (2 wheels);Back brace Nurse Communication: Mobility status  Activity Tolerance: Patient tolerated treatment well Patient left: with call bell/phone within reach;in chair  OT Visit Diagnosis: Unsteadiness on feet (R26.81);Muscle weakness (generalized) (M62.81)                Time: 1610-9604 OT Time Calculation (min): 22 min Charges:  OT General Charges $OT Visit: 1 Visit OT Evaluation $OT Eval Low Complexity: 1 Low  Ukiah Trawick  Franciso [place], OTR/L [facility] Acute Rehabilitation Office: [phone]   Myrla Halsted 02/12/2023, 10:17 AM

## 2023-02-12 NOTE — Discharge Instructions (Signed)

## 2023-02-12 NOTE — Evaluation (Signed)
Physical Therapy Evaluation Patient Details Name: Jonathon Zimmerman MRN: [id number] DOB: Aug 02, 1956 Today's Date: 02/12/2023  History of Present Illness  Pt is a 67 y.o. male s/p PLIF L3-4 on 02/11/2023. PMH significant for L4-S1 PLIF, HTN, R total knee replacement, cervical spinal surgery.   Clinical Impression  PT eval complete. Pt required min guard assist bed mobility, transfers, and amb 200' with RW. Educated on 3/3 back precautions. Per pt, plan is for d/c home today. Recommend HHPT to assist with transition off RW. No DME needed. PT signing off.        Recommendations for follow up therapy are one component of a multi-disciplinary discharge planning process, led by the attending physician.  Recommendations may be updated based on patient status, additional functional criteria and insurance authorization.  Follow Up Recommendations       Assistance Recommended at Discharge PRN  Patient can return home with the following  A little help with walking and/or transfers;A little help with bathing/dressing/bathroom;Assistance with cooking/housework;Assist for transportation;Help with stairs or ramp for entrance    Equipment Recommendations None recommended by PT  Recommendations for Other Services       Functional Status Assessment Patient has had a recent decline in their functional status and demonstrates the ability to make significant improvements in function in a reasonable and predictable amount of time.     Precautions / Restrictions Precautions Precautions: Back;Fall Precaution Comments: Educated on 3/3 back precautions. Required Braces or Orthoses: Spinal Brace Spinal Brace: Thoracolumbosacral orthotic;Applied in sitting position      Mobility  Bed Mobility Overal bed mobility: Needs Assistance Bed Mobility: Rolling, Sidelying to Sit Rolling: Modified independent (Device/Increase time) Sidelying to sit: Min guard       General bed mobility comments: cues for  logroll    Transfers Overall transfer level: Needs assistance Equipment used: Rolling walker (2 wheels) Transfers: Sit to/from Stand Sit to Stand: Min guard                Ambulation/Gait Ambulation/Gait assistance: Min guard Gait Distance (Feet): 200 Feet Assistive device: Rolling walker (2 wheels) Gait Pattern/deviations: Step-through pattern, Decreased stride length, Decreased dorsiflexion - left Gait velocity: decreased Gait velocity interpretation: <1.31 ft/sec, indicative of household ambulator   General Gait Details: decreased hip/knee flexion L, decreased foot clearance L  Stairs Stairs: Yes       General stair comments: Educated in room on stair management, sideways with rail and step to pattern. Pt verbalizes understanding. Pt's wife to assist into the house.  Wheelchair Mobility    Modified Rankin (Stroke Patients Only)       Balance Overall balance assessment: Needs assistance Sitting-balance support: No upper extremity supported, Feet supported Sitting balance-Leahy Scale: Good     Standing balance support: Bilateral upper extremity supported, No upper extremity supported, During functional activity Standing balance-Leahy Scale: Poor Standing balance comment: reliant on external support                             Pertinent Vitals/Pain Pain Assessment Pain Assessment: 0-10 Pain Score: 4  Pain Location: back Pain Descriptors / Indicators: Discomfort, Grimacing, Sore Pain Intervention(s): Monitored during session, Repositioned    Home Living Family/patient expects to be discharged to:: Private residence Living Arrangements: Spouse/significant other;Children Available Help at Discharge: Family;Available 24 hours/day Type of Home: House Home Access: Stairs to enter Entrance Stairs-Rails: Left Entrance Stairs-Number of Steps: 3   Home Layout: Able to live on  main level with bedroom/bathroom;Two level Home Equipment: Shower seat  - built [employer] fundraiser (2 wheels);Cane - single point      Prior Function Prior Level of Function : Independent/Modified Independent;Driving             Mobility Comments: amb with cane vs no AD ADLs Comments: Pt reports no assist needed     Hand Dominance   Dominant Hand: Right    Extremity/Trunk Assessment   Upper Extremity Assessment Upper Extremity Assessment: Defer to OT evaluation    Lower Extremity Assessment Lower Extremity Assessment: Generalized weakness    Cervical / Trunk Assessment Cervical / Trunk Assessment: Back Surgery  Communication   Communication: No difficulties  Cognition Arousal/Alertness: Awake/alert Behavior During Therapy: WFL for tasks assessed/performed Overall Cognitive Status: Within Functional Limits for tasks assessed                                          General Comments      Exercises     Assessment/Plan    PT Assessment All further PT needs can be met in the next venue of care  PT Problem List Decreased strength;Decreased balance;Pain;Decreased mobility;Decreased activity tolerance;Decreased knowledge of precautions;Decreased knowledge of use of DME       PT Treatment Interventions      PT Goals (Current goals can be found in the Care Plan section)  Acute Rehab PT Goals Patient Stated Goal: home today PT Goal Formulation: All assessment and education complete, DC therapy    Frequency       Co-evaluation               AM-PAC PT "6 Clicks" Mobility  Outcome Measure Help needed turning from your back to your side while in a flat bed without using bedrails?: None Help needed moving from lying on your back to sitting on the side of a flat bed without using bedrails?: A Little Help needed moving to and from a bed to a chair (including a wheelchair)?: A Little Help needed standing up from a chair using your arms (e.g., wheelchair or bedside chair)?: A Little Help needed to walk in hospital  room?: A Little Help needed climbing 3-5 steps with a railing? : A Little 6 Click Score: 19    End of Session Equipment Utilized During Treatment: Gait belt;Back brace Activity Tolerance: Patient tolerated treatment well Patient left: in chair;with call bell/phone within reach Nurse Communication: Mobility status PT Visit Diagnosis: Other abnormalities of gait and mobility (R26.89);Pain    Time: 3664-4034 PT Time Calculation (min) (ACUTE ONLY): 25 min   Charges:   PT Evaluation $PT Eval Moderate Complexity: 1 Mod          Ferd Glassing., PT  Office # [phone]   Ilda Foil 02/12/2023, 9:07 AM

## 2023-02-12 NOTE — Progress Notes (Addendum)
Pt discharging home. Order to change dressing. Pt's nurse Porsche changed dressing earlier this morning, note bloody drainage on dressing as was on dressing when MD rounded per pt.   Dressing removed padded area dry, gauze and honey comb dressing applied.  Noted bloody drainage coming from upper incision area when padding dry, therefore folded 4x4 gauze dressing placed under the honeycomb dressing.  Supplies provided for pt to take home, wife at bedside and shown how to change dressing.   Discharge instructions reviewed with pt and his wife.  Copy of instructions given to pt.  Pt informed scripts sent to his pharmacy for pick up. Pt to be d/c'd via wheelchair with belongings, with wife.            To be escorted by staff.   Annice Needy, RN SWOT

## 2023-02-12 NOTE — TOC Transition Note (Addendum)
Transition of Care [facility]) - CM/SW Discharge Note   Patient Details  Name: Jonathon Zimmerman MRN: [id number] Date of Birth: Jul 27, 1956  [facility] CM/SW Contact:  Lawerance Sabal, RN Phone Number: 02/12/2023, 9:44 AM   Clinical Narrative:     Sherron Monday w patient at bedside. He states that he has RW at home, support from after DC and a ride home.  We spoke of follow up for therapies. He states he has been instructed to go to follow w Neuro Sx and it will be determined at that time. He cited his follow up appointment date, it had been scheduled pre op.    Final next level of care: Home/Self Care Barriers to Discharge: No Barriers Identified   Patient Goals and CMS Choice      Discharge Placement                         Discharge Plan and Services Additional resources added to the After Visit Summary for                  DME Arranged: N/A DME Agency: NA         HH Agency: NA        Social Determinants of Health (SDOH) Interventions SDOH Screenings   Tobacco Use: Medium Risk (02/11/2023)     Readmission Risk Interventions     No data to display

## 2023-02-12 NOTE — Discharge Summary (Signed)
Physician Discharge Summary  Patient ID: Jonathon Zimmerman MRN: [id number] DOB/AGE: 19-Jan-1956 67 y.o.  Admit date: 02/11/2023 Discharge date: 02/12/2023  Admission Diagnoses:  Discharge Diagnoses:  Principal Problem:   Lumbar spinal stenosis due to adjacent segment disease after fusion procedure   Discharged Condition: good  Hospital Course: Patient admitted to the hospital where he underwent uncomplicated L3-4 decompression and fusion.  Postoperatively doing well.  Minimal back pain.  No lower extremity symptoms.  Standing ambulating and voiding without difficulty.  Ready for discharge home.  Consults:   Significant Diagnostic Studies:   Treatments:   Discharge Exam: Blood pressure 108/72, pulse 66, temperature 97.6 F (36.4 C), temperature source Oral, resp. rate 18, height 5\' 9"  (1.753 m), weight 88.5 kg, SpO2 100 %. Awake and alert.  Oriented and appropriate.  Motor and sensory function intact.  Wound healing well with some mild bloody discharge on the dressing.  Chest and abdomen are benign.  Extremities free from injury or deformity.  Disposition: Discharge disposition: 01-Home or Self Care       Discharge Instructions     Change dressing   Complete by: As directed       Allergies as of 02/12/2023       Reactions   Oxcarbazepine    Hyponatremia        Medication List     TAKE these medications    amLODipine 5 MG tablet Commonly known as: NORVASC Take 5 mg by mouth daily.   aspirin EC 81 MG tablet Take 81 mg by mouth daily. Swallow whole.   aspirin-acetaminophen-caffeine 250-250-65 MG tablet Commonly known as: EXCEDRIN MIGRAINE Take 2 tablets by mouth every 6 (six) hours as needed for headache.   ferrous sulfate 325 (65 FE) MG tablet Take 325 mg by mouth daily.   finasteride 5 MG tablet Commonly known as: PROSCAR Take 5 mg by mouth daily.   gabapentin 800 MG tablet Commonly known as: NEURONTIN Take 800 mg by mouth 5 (five) times daily.    ibuprofen 800 MG tablet Commonly known as: ADVIL Take 800 mg by mouth every 8 (eight) hours as needed for moderate pain.   ibuprofen 200 MG tablet Commonly known as: ADVIL Take 400 mg by mouth every 6 (six) hours as needed for headache.   lisinopril 20 MG tablet Commonly known as: ZESTRIL Take 20 mg by mouth daily.   METAMUCIL FIBER PO Take 1 Scoop by mouth daily.   methocarbamol 750 MG tablet Commonly known as: Robaxin-750 Take 1 tablet (750 mg total) by mouth every 6 (six) hours as needed for muscle spasms.   naproxen sodium 220 MG tablet Commonly known as: ALEVE Take 440 mg by mouth daily as needed (pain).   OVER THE COUNTER MEDICATION Take 1 packet by mouth in the morning, at noon, and at bedtime. Relief Factor supplement   OVER THE COUNTER MEDICATION Take 3 capsules by mouth daily. Balance of nature fruit   OVER THE COUNTER MEDICATION Take 3 capsules by mouth daily. Balance of nature vegetables   Oxycodone HCl 10 MG Tabs Take 1 tablet (10 mg total) by mouth every 3 (three) hours as needed for severe pain ((score 7 to 10)).   oxyCODONE-acetaminophen 5-325 MG tablet Commonly known as: PERCOCET/ROXICET Take 1 tablet by mouth every 6 (six) hours as needed for severe pain.   pantoprazole 40 MG tablet Commonly known as: PROTONIX Take 40 mg by mouth daily.   sildenafil 100 MG tablet Commonly known as: VIAGRA Take 100 mg  by mouth daily as needed for erectile dysfunction.   tamsulosin 0.4 MG Caps capsule Commonly known as: FLOMAX Take 0.4 mg by mouth at bedtime.               Durable Medical Equipment  (From admission, onward)           Start     Ordered   02/11/23 1514  DME Walker rolling  Once       Question:  Patient needs a walker to treat with the following condition  Answer:  Lumbar spinal stenosis due to adjacent segment disease after fusion procedure   02/11/23 1513   02/11/23 1514  DME 3 n 1  Once        02/11/23 1513               Discharge Care Instructions  (From admission, onward)           Start     Ordered   02/12/23 0000  Change dressing        02/12/23 1031             Signed: Sherilyn Cooter A Aldona Bryner 02/12/2023, 10:31 AM

## 2023-02-21 MED FILL — Sodium Chloride IV Soln 0.9%: INTRAVENOUS | Qty: 1000 | Status: AC

## 2023-02-21 MED FILL — Heparin Sodium (Porcine) Inj 1000 Unit/ML: INTRAMUSCULAR | Qty: 30 | Status: AC

## 2023-05-13 ENCOUNTER — Other Ambulatory Visit: Payer: Self-pay

## 2023-05-13 ENCOUNTER — Encounter: Payer: Self-pay | Admitting: Physical Therapy

## 2023-05-13 ENCOUNTER — Ambulatory Visit: Payer: No Typology Code available for payment source | Attending: Neurosurgery | Admitting: Physical Therapy

## 2023-05-13 DIAGNOSIS — M48061 Spinal stenosis, lumbar region without neurogenic claudication: Secondary | ICD-10-CM | POA: Diagnosis present

## 2023-05-13 DIAGNOSIS — R29898 Other symptoms and signs involving the musculoskeletal system: Secondary | ICD-10-CM | POA: Diagnosis not present

## 2023-05-13 DIAGNOSIS — M6281 Muscle weakness (generalized): Secondary | ICD-10-CM | POA: Diagnosis not present

## 2023-05-13 NOTE — Therapy (Signed)
OUTPATIENT PHYSICAL THERAPY THORACOLUMBAR EVALUATION   Patient Name: Jonathon Zimmerman MRN: [id number] DOB:[birth year], 67 y.o., male Today's Date: 05/13/2023  END OF SESSION:  PT End of Session - 05/13/23 1521     Visit Number 1    Number of Visits 12    Date for PT Re-Evaluation 06/24/23    Authorization Type [facility]    PT Start Time 1445    PT Stop Time 1522    PT Time Calculation (min) 37 min    Activity Tolerance Patient tolerated treatment well    Behavior During Therapy WFL for tasks assessed/performed             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   Spinal stenosis    Past Surgical History:  Procedure Laterality Date   CERVICAL SPINE SURGERY     COLONOSCOPY     EYE SURGERY Bilateral 2004   Lasix   FRACTURE SURGERY Right 1969   Chipped bone repair on knee   JOINT REPLACEMENT     LUMBAR SPINE SURGERY     REPLACEMENT TOTAL KNEE Right    VASECTOMY  1984   Patient Active Problem List   Diagnosis Date Noted   Lumbar spinal stenosis due to adjacent segment disease after fusion procedure 02/11/2023   Degenerative spondylolisthesis 06/19/2020   Iron deficiency anemia 01/14/2020   Preop cardiovascular exam 01/14/2020   Dysphagia 01/14/2020   Cervical radiculopathy 12/08/2019   Essential hypertension 12/08/2019   Gastroesophageal reflux disease without esophagitis 12/08/2019    PCP: none on file  REFERRING PROVIDER: Julio Sicks  REFERRING DIAG: lumbar spinal stenosis  Rationale for Evaluation and Treatment: Rehabilitation  THERAPY DIAG:  Muscle weakness (generalized)  Other symptoms and signs involving the musculoskeletal system  ONSET DATE:  02/11/2023  SUBJECTIVE:                                                                                                                                                                                           SUBJECTIVE STATEMENT: Pt s/p lumbar fusion surgery 02/11/2023. He states that since the surgery he still feels weak in his LEs and "tight" in his back. He states he has noticed decreased balance with SLS during dressing tasks. No c/o pain  PERTINENT HISTORY:  Rt total knee replacement  2 x cervical surgery 2 x lumbar surgery  PAIN:  Are you having pain? No  PRECAUTIONS: None  RED FLAGS: None   WEIGHT BEARING RESTRICTIONS: No  FALLS:  Has patient fallen in last 6 months? No   OCCUPATION: retired, enjoys riding his bike, going to the gym  PLOF: Independent  PATIENT GOALS: increase leg strength, be able to stand tall  NEXT MD VISIT: 07/2023  OBJECTIVE:   DIAGNOSTIC FINDINGS:  None on file  PATIENT SURVEYS:  FOTO 56    COGNITION: Overall cognitive status: Within functional limits for tasks assessed     SENSATION: Pt c/o some "burning" in bilat upper thighs and bilat UEs  MUSCLE LENGTH: Hamstrings: Right 50 deg; Left 50 deg   POSTURE: flexed trunk   PALPATION: TTP bilat QL  Increased  muscle spasticity bilat QL, bilat lumbar paraspinals  LUMBAR ROM:   AROM eval  Flexion 50%  Extension 50%  Right lateral flexion 50%  Left lateral flexion 50%  Right rotation 25%  Left rotation 50%   (Blank rows = not tested)   LOWER EXTREMITY MMT:    MMT Right eval Left eval  Hip flexion 3+ 3  Hip extension 3 3-  Hip abduction 3 3  Hip adduction    Hip internal rotation    Hip external rotation    Knee flexion    Knee extension    Ankle dorsiflexion    Ankle plantarflexion    Ankle inversion    Ankle eversion     (Blank rows = not tested)  FUNCTIONAL TESTS:  Rt SLS 4 sec Lt SLS 2 sec 5x STS: 14.96 seconds    TODAY'S TREATMENT:                                                                                                                               DATE: 05/13/23 See HEP    PATIENT EDUCATION:  Education details: PT POC and goals, HEP Person educated: Patient Education method: Explanation, Demonstration, and Handouts Education comprehension: verbalized understanding and returned demonstration  HOME EXERCISE PROGRAM: Access Code: [facility] URL: [email or web address] Date: 05/13/2023 Prepared by: Reggy Eye  Exercises - Supine Lower Trunk Rotation  - 1 x daily - 7 x weekly - 2 sets - 10 reps - Hooklying Single Knee to Chest Stretch  - 1 x daily - 7 x weekly - 1 sets - 3 reps - 20-30 seconds hold - Hip Abduction with Resistance Loop  - 1 x daily - 7 x weekly - 2 sets - 10 reps - Hip Extension with Resistance Loop  - 1 x daily - 7 x weekly - 2 sets - 10 reps - Sit to Stand Without Arm Support  - 1 x daily - 7 x weekly - 2 sets - 10 reps  ASSESSMENT:  CLINICAL IMPRESSION: Patient is a 67 y.o. male who was seen today for physical therapy evaluation and treatment for lumbar spinal stenosis. Pt  presents with decreased lumbar ROM, decreased core and LE strength, decreased functional mobility, decreased balance. Pt will benefit from skilled PT to address deficits and improve functional mobility.   OBJECTIVE IMPAIRMENTS: decreased activity tolerance, decreased ROM, decreased strength, increased muscle spasms, and impaired flexibility.   ACTIVITY LIMITATIONS: bending, bathing, dressing, and locomotion level  PARTICIPATION LIMITATIONS: community activity  PERSONAL FACTORS: 3+ comorbidities: history of knee, cervical and lumbar surgeries  are also affecting patient's functional outcome.   REHAB POTENTIAL: Good  CLINICAL DECISION MAKING: Evolving/moderate complexity  EVALUATION COMPLEXITY: Moderate   GOALS: Goals reviewed with patient? Yes  SHORT TERM GOALS: Target date: 05/27/2023    Pt will be  independent with initial HEP Baseline: Goal status: INITIAL  2.  Pt will improve 5x STS to <= 13 seconds Baseline:  Goal status: INITIAL   LONG TERM GOALS: Target date: 06/24/2023    Pt will be independent with advanced HEP Baseline:  Goal status: INITIAL  2.  Pt will demo improved functional mobility with FOTO >= 65 Baseline:  Goal status: INITIAL  3.  Pt will demo improved LE strength with 5x STS <= 11 seconds Baseline:  Goal status: INITIAL  4.  Pt will demo improved balance with SLS >= 10 sec bilat Baseline:  Goal status: INITIAL  5.  Pt will return to gym routine without increase in symptoms Baseline:  Goal status: INITIAL  PLAN:  PT FREQUENCY: 2x/week  PT DURATION: 6 weeks  PLANNED INTERVENTIONS: Therapeutic exercises, Therapeutic activity, Neuromuscular re-education, Balance training, Gait training, Patient/Family education, Self Care, Joint mobilization, Aquatic Therapy, Dry Needling, Electrical stimulation, Cryotherapy, Moist heat, Taping, Ionotophoresis 4mg /ml Dexamethasone, Manual therapy, and Re-evaluation.  PLAN FOR NEXT SESSION: Add balance and core strength, continue LE strength and trunk mobility   Cheyann Blecha, PT 05/13/2023, 3:22 PM

## 2023-05-16 ENCOUNTER — Ambulatory Visit: Payer: No Typology Code available for payment source | Admitting: Physical Therapy

## 2023-05-20 ENCOUNTER — Encounter: Payer: Self-pay | Admitting: Physical Therapy

## 2023-05-20 ENCOUNTER — Ambulatory Visit: Payer: No Typology Code available for payment source | Attending: Neurosurgery | Admitting: Physical Therapy

## 2023-05-20 DIAGNOSIS — M6281 Muscle weakness (generalized): Secondary | ICD-10-CM | POA: Diagnosis present

## 2023-05-20 DIAGNOSIS — R29898 Other symptoms and signs involving the musculoskeletal system: Secondary | ICD-10-CM | POA: Insufficient documentation

## 2023-05-20 NOTE — Therapy (Signed)
OUTPATIENT PHYSICAL THERAPY   Patient Name: Jonathon Zimmerman MRN: [id number] DOB:[birth year], 67 y.o., male Today's Date: 05/20/2023  END OF SESSION:  PT End of Session - 05/20/23 1430     Visit Number 2    Number of Visits 12    Date for PT Re-Evaluation 06/24/23    Authorization Type [facility] 15 visits approved until 11/03/23    Authorization - Visit Number 2    Authorization - Number of Visits 15    Progress Note Due on Visit 15    PT Start Time 1357    PT Stop Time 1437    PT Time Calculation (min) 40 min    Activity Tolerance Patient tolerated treatment well    Behavior During Therapy WFL for tasks assessed/performed              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   Spinal stenosis    Past Surgical History:  Procedure Laterality Date   CERVICAL SPINE SURGERY     COLONOSCOPY     EYE SURGERY Bilateral 2004   Lasix   FRACTURE SURGERY Right 1969   Chipped bone repair on knee   JOINT REPLACEMENT     LUMBAR SPINE SURGERY     REPLACEMENT TOTAL KNEE Right    VASECTOMY  1984   Patient Active Problem List   Diagnosis Date Noted   Lumbar spinal stenosis due to adjacent segment disease after fusion procedure 02/11/2023   Degenerative spondylolisthesis 06/19/2020   Iron deficiency anemia 01/14/2020   Preop cardiovascular exam 01/14/2020   Dysphagia 01/14/2020   Cervical radiculopathy 12/08/2019   Essential hypertension 12/08/2019   Gastroesophageal reflux disease without esophagitis 12/08/2019    PCP: none on file  REFERRING PROVIDER: Julio Sicks  REFERRING DIAG: lumbar spinal stenosis  Rationale for Evaluation and Treatment: Rehabilitation  THERAPY DIAG:  Muscle  weakness (generalized)  Other symptoms and signs involving the musculoskeletal system  ONSET DATE: 02/11/2023  SUBJECTIVE:                                                                                                                                                                                           SUBJECTIVE STATEMENT: Pt states he feels "stiff" and is having more pain today. He says he has been compliant  with HEP and has been working out at [employer] as usual but he has been having more pain since the weekend  PERTINENT HISTORY:  Rt total knee replacement 2 x cervical surgery 2 x lumbar surgery  PAIN:  Are you having pain? 5/10 bilat QL to low back  PRECAUTIONS: None  RED FLAGS: None   WEIGHT BEARING RESTRICTIONS: No  FALLS:  Has patient fallen in last 6 months? No   OCCUPATION: retired, enjoys riding his bike, going to the gym  PLOF: Independent  PATIENT GOALS: increase leg strength, be able to stand tall  NEXT MD VISIT: 07/2023  OBJECTIVE:    PATIENT SURVEYS:  FOTO 56    SENSATION: Pt c/o some "burning" in bilat upper thighs and bilat UEs  MUSCLE LENGTH: Hamstrings: Right 50 deg; Left 50 deg   POSTURE: flexed trunk   PALPATION: TTP bilat QL  Increased  muscle spasticity bilat QL, bilat lumbar paraspinals  LUMBAR ROM:   AROM eval  Flexion 50%  Extension 50%  Right lateral flexion 50%  Left lateral flexion 50%  Right rotation 25%  Left rotation 50%   (Blank rows = not tested)   LOWER EXTREMITY MMT:    MMT Right eval Left eval  Hip flexion 3+ 3  Hip extension 3 3-  Hip abduction 3 3  Hip adduction    Hip internal rotation    Hip external rotation    Knee flexion    Knee extension    Ankle dorsiflexion    Ankle plantarflexion    Ankle inversion    Ankle eversion     (Blank rows = not tested)  FUNCTIONAL TESTS:  Rt SLS 4 sec Lt SLS 2 sec 5x STS: 14.96 seconds    TODAY'S TREATMENT:                                                                                                                               [facility] Adult PT Treatment:                                                DATE: 05/20/23 Therapeutic Exercise: Recumbent bike L3 x 5 min Physioball roll outs all directions x 10 Seated trunk rotation x 10 Quadruped: cat/cow x 10, tail wag x 10 Doorway QL stretch 2 x 20 sec bilat Manual Therapy: STM bilat lumbar paraspinals and QL  Modalities: Moist heat low back x 10 min TENS low back x 10 min to tolerance   DATE: 05/13/23 See HEP    PATIENT EDUCATION:  Education details: PT POC and goals, HEP Person educated: Patient Education method: Explanation, Demonstration, and Handouts Education comprehension: verbalized understanding and returned demonstration  HOME EXERCISE PROGRAM: Access Code: [facility] URL: [email or web address] Date: 05/13/2023 Prepared by: Reggy Eye  Exercises - Supine Lower Trunk Rotation  -  1 x daily - 7 x weekly - 2 sets - 10 reps - Hooklying Single Knee to Chest Stretch  - 1 x daily - 7 x weekly - 1 sets - 3 reps - 20-30 seconds hold - Hip Abduction with Resistance Loop  - 1 x daily - 7 x weekly - 2 sets - 10 reps - Hip Extension with Resistance Loop  - 1 x daily - 7 x weekly - 2 sets - 10 reps - Sit to Stand Without Arm Support  - 1 x daily - 7 x weekly - 2 sets - 10 reps  ASSESSMENT:  CLINICAL IMPRESSION: Pt with increased pain today. Focused on gentle stretching, manual and modalities for pain control. Plan to add balance and increased core and hip strength next visit as appropriate     GOALS: Goals reviewed with patient? Yes  SHORT TERM GOALS: Target date: 05/27/2023    Pt will be independent with initial HEP Baseline: Goal status: INITIAL  2.  Pt will improve 5x STS to <= 13 seconds Baseline:  Goal status: INITIAL   LONG TERM GOALS: Target date: 06/24/2023    Pt will be independent with advanced HEP Baseline:  Goal status:  INITIAL  2.  Pt will demo improved functional mobility with FOTO >= 65 Baseline:  Goal status: INITIAL  3.  Pt will demo improved LE strength with 5x STS <= 11 seconds Baseline:  Goal status: INITIAL  4.  Pt will demo improved balance with SLS >= 10 sec bilat Baseline:  Goal status: INITIAL  5.  Pt will return to gym routine without increase in symptoms Baseline:  Goal status: INITIAL  PLAN:  PT FREQUENCY: 2x/week  PT DURATION: 6 weeks  PLANNED INTERVENTIONS: Therapeutic exercises, Therapeutic activity, Neuromuscular re-education, Balance training, Gait training, Patient/Family education, Self Care, Joint mobilization, Aquatic Therapy, Dry Needling, Electrical stimulation, Cryotherapy, Moist heat, Taping, Ionotophoresis 4mg /ml Dexamethasone, Manual therapy, and Re-evaluation.  PLAN FOR NEXT SESSION: How is pain? Add balance and core strength, continue LE strength and trunk mobility   Lizett Chowning, PT 05/20/2023, 2:31 PM

## 2023-05-23 ENCOUNTER — Ambulatory Visit: Payer: No Typology Code available for payment source | Admitting: Physical Therapy

## 2023-05-23 ENCOUNTER — Encounter: Payer: Self-pay | Admitting: Physical Therapy

## 2023-05-23 DIAGNOSIS — R29898 Other symptoms and signs involving the musculoskeletal system: Secondary | ICD-10-CM

## 2023-05-23 DIAGNOSIS — M6281 Muscle weakness (generalized): Secondary | ICD-10-CM

## 2023-05-23 NOTE — Therapy (Signed)
OUTPATIENT PHYSICAL THERAPY   Patient Name: Jonathon Zimmerman MRN: [id number] DOB:03-07-1956, 67 y.o., male Today's Date: 05/23/2023  END OF SESSION:  PT End of Session - 05/23/23 0932     Visit Number 3    Number of Visits 12    Date for PT Re-Evaluation 06/24/23    Authorization Type [facility] 15 visits approved until 11/03/23    Authorization - Visit Number 3    Authorization - Number of Visits 15    Progress Note Due on Visit 15    PT Start Time 0845    PT Stop Time 0925    PT Time Calculation (min) 40 min    Activity Tolerance Patient tolerated treatment well    Behavior During Therapy WFL for tasks assessed/performed               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   Spinal stenosis    Past Surgical History:  Procedure Laterality Date   CERVICAL SPINE SURGERY     COLONOSCOPY     EYE SURGERY Bilateral 2004   Lasix   FRACTURE SURGERY Right 1969   Chipped bone repair on knee   JOINT REPLACEMENT     LUMBAR SPINE SURGERY     REPLACEMENT TOTAL KNEE Right    VASECTOMY  1984   Patient Active Problem List   Diagnosis Date Noted   Lumbar spinal stenosis due to adjacent segment disease after fusion procedure 02/11/2023   Degenerative spondylolisthesis 06/19/2020   Iron deficiency anemia 01/14/2020   Preop cardiovascular exam 01/14/2020   Dysphagia 01/14/2020   Cervical radiculopathy 12/08/2019   Essential hypertension 12/08/2019   Gastroesophageal reflux disease without esophagitis 12/08/2019    PCP: none on file  REFERRING PROVIDER: Julio Sicks  REFERRING DIAG: lumbar spinal stenosis  Rationale for Evaluation and Treatment: Rehabilitation  THERAPY DIAG:  Muscle  weakness (generalized)  Other symptoms and signs involving the musculoskeletal system  ONSET DATE: 02/11/2023  SUBJECTIVE:                                                                                                                                                                                           SUBJECTIVE STATEMENT: Pt states he is feeling better than last visit but still sore. He went to pain  management MD on Wednesday who recommended aquatic therapy and lidocaine patches  PERTINENT HISTORY:  Rt total knee replacement 2 x cervical surgery 2 x lumbar surgery  PAIN:  Are you having pain? 4/10 bilat QL to low back  PRECAUTIONS: None  RED FLAGS: None   WEIGHT BEARING RESTRICTIONS: No  FALLS:  Has patient fallen in last 6 months? No   OCCUPATION: retired, enjoys riding his bike, going to the gym  PLOF: Independent  PATIENT GOALS: increase leg strength, be able to stand tall  NEXT MD VISIT: 07/2023  OBJECTIVE:    PATIENT SURVEYS:  FOTO 56    SENSATION: Pt c/o some "burning" in bilat upper thighs and bilat UEs  MUSCLE LENGTH: Hamstrings: Right 50 deg; Left 50 deg   POSTURE: flexed trunk   PALPATION: TTP bilat QL  Increased  muscle spasticity bilat QL, bilat lumbar paraspinals  LUMBAR ROM:   AROM eval  Flexion 50%  Extension 50%  Right lateral flexion 50%  Left lateral flexion 50%  Right rotation 25%  Left rotation 50%   (Blank rows = not tested)   LOWER EXTREMITY MMT:    MMT Right eval Left eval  Hip flexion 3+ 3  Hip extension 3 3-  Hip abduction 3 3  Hip adduction    Hip internal rotation    Hip external rotation    Knee flexion    Knee extension    Ankle dorsiflexion    Ankle plantarflexion    Ankle inversion    Ankle eversion     (Blank rows = not tested)  FUNCTIONAL TESTS:  Rt SLS 4 sec Lt SLS 2 sec 5x STS: 14.96 seconds    TODAY'S TREATMENT:                                                                                                                               [facility] Adult PT Treatment:                                                DATE: 05/23/23 Therapeutic Exercise: Nustep L5 x 5 min Physioball roll outs all directions x 10 Standing trunk ext against physioball 10 x 10 sec Standing trunk ext at counter 10 x 3 sec Cat/cow x 10 - cues for form Side step red TB around knees Backward step red TB around knees Mini squat at counter with red TB around knees x 10 Pallof press green TB 2 x 10 bilat Manual Therapy: STM lumbar paraspinals, QL bilat PROM bilat hips, passive quad and hip flexor stretch   [facility] Adult PT Treatment:  DATE: 05/20/23 Therapeutic Exercise: Recumbent bike L3 x 5 min Physioball roll outs all directions x 10 Seated trunk rotation x 10 Quadruped: cat/cow x 10, tail wag x 10 Doorway QL stretch 2 x 20 sec bilat Manual Therapy: STM bilat lumbar paraspinals and QL  Modalities: Moist heat low back x 10 min TENS low back x 10 min to tolerance   PATIENT EDUCATION:  Education details: PT POC and goals, HEP Person educated: Patient Education method: Explanation, Demonstration, and Handouts Education comprehension: verbalized understanding and returned demonstration  HOME EXERCISE PROGRAM: Access Code: [facility] URL: [email or web address] Date: 05/13/2023 Prepared by: Reggy Eye  Exercises - Supine Lower Trunk Rotation  - 1 x daily - 7 x weekly - 2 sets - 10 reps - Hooklying Single Knee to Chest Stretch  - 1 x daily - 7 x weekly - 1 sets - 3 reps - 20-30 seconds hold - Hip Abduction with Resistance Loop  - 1 x daily - 7 x weekly - 2 sets - 10 reps - Hip Extension with Resistance Loop  - 1 x daily - 7 x weekly - 2 sets - 10 reps - Sit to Stand Without Arm Support  - 1 x daily - 7 x weekly - 2 sets - 10 reps  ASSESSMENT:  CLINICAL IMPRESSION: Pt with good relief with physioball extensions today. More  tolerant of extension movements today. Continues with pain with bed mobility but with much improved exercise tolerance this session    GOALS: Goals reviewed with patient? Yes  SHORT TERM GOALS: Target date: 05/27/2023    Pt will be independent with initial HEP Baseline: Goal status: INITIAL  2.  Pt will improve 5x STS to <= 13 seconds Baseline:  Goal status: INITIAL   LONG TERM GOALS: Target date: 06/24/2023    Pt will be independent with advanced HEP Baseline:  Goal status: INITIAL  2.  Pt will demo improved functional mobility with FOTO >= 65 Baseline:  Goal status: INITIAL  3.  Pt will demo improved LE strength with 5x STS <= 11 seconds Baseline:  Goal status: INITIAL  4.  Pt will demo improved balance with SLS >= 10 sec bilat Baseline:  Goal status: INITIAL  5.  Pt will return to gym routine without increase in symptoms Baseline:  Goal status: INITIAL  PLAN:  PT FREQUENCY: 2x/week  PT DURATION: 6 weeks  PLANNED INTERVENTIONS: Therapeutic exercises, Therapeutic activity, Neuromuscular re-education, Balance training, Gait training, Patient/Family education, Self Care, Joint mobilization, Aquatic Therapy, Dry Needling, Electrical stimulation, Cryotherapy, Moist heat, Taping, Ionotophoresis 4mg /ml Dexamethasone, Manual therapy, and Re-evaluation.  PLAN FOR NEXT SESSION:  Add balance and core strength, continue LE strength and trunk mobility   Georgi Navarrete, PT 05/23/2023, 9:34 AM

## 2023-05-27 ENCOUNTER — Ambulatory Visit: Payer: No Typology Code available for payment source | Admitting: Physical Therapy

## 2023-05-27 ENCOUNTER — Encounter: Payer: Self-pay | Admitting: Physical Therapy

## 2023-05-27 DIAGNOSIS — R29898 Other symptoms and signs involving the musculoskeletal system: Secondary | ICD-10-CM

## 2023-05-27 DIAGNOSIS — M6281 Muscle weakness (generalized): Secondary | ICD-10-CM

## 2023-05-27 NOTE — Therapy (Signed)
OUTPATIENT PHYSICAL THERAPY   Patient Name: Jonathon Zimmerman MRN: [id number] DOB:[birth year], 67 y.o., male Today's Date: 05/27/2023  END OF SESSION:  PT End of Session - 05/27/23 1448     Visit Number 4    Number of Visits 12    Date for PT Re-Evaluation 06/24/23    Authorization Type [facility] 15 visits approved until 11/03/23    Authorization - Visit Number 4    Authorization - Number of Visits 15    Progress Note Due on Visit 15    PT Start Time 1400    PT Stop Time 1442    PT Time Calculation (min) 42 min    Activity Tolerance Patient tolerated treatment well    Behavior During Therapy WFL for tasks assessed/performed                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   Spinal stenosis    Past Surgical History:  Procedure Laterality Date   CERVICAL SPINE SURGERY     COLONOSCOPY     EYE SURGERY Bilateral 2004   Lasix   FRACTURE SURGERY Right 1969   Chipped bone repair on knee   JOINT REPLACEMENT     LUMBAR SPINE SURGERY     REPLACEMENT TOTAL KNEE Right    VASECTOMY  1984   Patient Active Problem List   Diagnosis Date Noted   Lumbar spinal stenosis due to adjacent segment disease after fusion procedure 02/11/2023   Degenerative spondylolisthesis 06/19/2020   Iron deficiency anemia 01/14/2020   Preop cardiovascular exam 01/14/2020   Dysphagia 01/14/2020   Cervical radiculopathy 12/08/2019   Essential hypertension 12/08/2019   Gastroesophageal reflux disease without esophagitis 12/08/2019    PCP: none on file  REFERRING PROVIDER: Julio Sicks  REFERRING DIAG: lumbar spinal stenosis  Rationale for Evaluation and Treatment: Rehabilitation  THERAPY DIAG:   Muscle weakness (generalized)  Other symptoms and signs involving the musculoskeletal system  ONSET DATE: 02/11/2023  SUBJECTIVE:                                                                                                                                                                                           SUBJECTIVE STATEMENT: Pt states he feels better but is "more stiff than I want to be"  PERTINENT HISTORY:  Rt total knee replacement 2 x cervical surgery 2 x lumbar surgery  PAIN:  Are you having pain? 3/10 bilat QL to low back  PRECAUTIONS: None  RED FLAGS: None   WEIGHT BEARING RESTRICTIONS: No  FALLS:  Has patient fallen in last 6 months? No   OCCUPATION: retired, enjoys riding his bike, going to the gym  PLOF: Independent  PATIENT GOALS: increase leg strength, be able to stand tall  NEXT MD VISIT: 07/2023  OBJECTIVE:    PATIENT SURVEYS:  FOTO 56    SENSATION: Pt c/o some "burning" in bilat upper thighs and bilat UEs  MUSCLE LENGTH: Hamstrings: Right 50 deg; Left 50 deg   POSTURE: flexed trunk   PALPATION: TTP bilat QL  Increased  muscle spasticity bilat QL, bilat lumbar paraspinals  LUMBAR ROM:   AROM eval  Flexion 50%  Extension 50%  Right lateral flexion 50%  Left lateral flexion 50%  Right rotation 25%  Left rotation 50%   (Blank rows = not tested)   LOWER EXTREMITY MMT:    MMT Right eval Left eval  Hip flexion 3+ 3  Hip extension 3 3-  Hip abduction 3 3  Hip adduction    Hip internal rotation    Hip external rotation    Knee flexion    Knee extension    Ankle dorsiflexion    Ankle plantarflexion    Ankle inversion    Ankle eversion     (Blank rows = not tested)  FUNCTIONAL TESTS:  Rt SLS 4 sec Lt SLS 2 sec 5x STS: 14.96 seconds (eval), 14.52 seconds (10/8)    TODAY'S TREATMENT:                                                                                                                               [facility] Adult PT Treatment:                                                DATE: 05/27/23 Therapeutic Exercise: Nustep L6 x 5 min LTR 10 x 3 sec hold Open book x 10 bilat Standing trunk ext against physioball 10 x 10 sec hold Diagonal hip ext red TB 2 x 10 Resisted walking backward 20# x 10 Cat cow at counter x 10 Seated physioball roll outs all directions x 10 5 x STS Manual Therapy: STM lumbar paraspinals, QL bilat PROM bilat hips, passive quad and hip flexor stretch   [facility] Adult PT Treatment:                                                DATE: 05/23/23 Therapeutic Exercise: Nustep L5 x 5 min  Physioball roll outs all directions x 10 Standing trunk ext against physioball 10 x 10 sec Standing trunk ext at counter 10 x 3 sec Cat/cow x 10 - cues for form Side step red TB around knees Backward step red TB around knees Mini squat at counter with red TB around knees x 10 Pallof press green TB 2 x 10 bilat Manual Therapy: STM lumbar paraspinals, QL bilat PROM bilat hips, passive quad and hip flexor stretch   [facility] Adult PT Treatment:                                                DATE: 05/20/23 Therapeutic Exercise: Recumbent bike L3 x 5 min Physioball roll outs all directions x 10 Seated trunk rotation x 10 Quadruped: cat/cow x 10, tail wag x 10 Doorway QL stretch 2 x 20 sec bilat Manual Therapy: STM bilat lumbar paraspinals and QL  Modalities: Moist heat low back x 10 min TENS low back x 10 min to tolerance   PATIENT EDUCATION:  Education details: PT POC and goals, HEP Person educated: Patient Education method: Explanation, Demonstration, and Handouts Education comprehension: verbalized understanding and returned demonstration  HOME EXERCISE PROGRAM: Access Code: [facility] URL: [email or web address] Date: 05/27/2023 Prepared by: Reggy Eye  Exercises - Supine Lower Trunk Rotation  - 1 x daily - 7 x weekly - 2 sets - 10 reps - Hooklying Single  Knee to Chest Stretch  - 1 x daily - 7 x weekly - 1 sets - 3 reps - 20-30 seconds hold - Hip Abduction with Resistance Loop  - 1 x daily - 7 x weekly - 2 sets - 10 reps - Hip Extension with Resistance Loop  - 1 x daily - 7 x weekly - 2 sets - 10 reps - Sit to Stand Without Arm Support  - 1 x daily - 7 x weekly - 2 sets - 10 reps - Sidelying Open Book Thoracic Lumbar Rotation and Extension  - 1 x daily - 7 x weekly - 2 sets - 10 reps - Modified Cat Cow on Counter  - 1 x daily - 7 x weekly - 3 sets - 10 reps  ASSESSMENT:  CLINICAL IMPRESSION: Session focused on continuing to address trunk mobility and core strength. Added open book and cat cow to HEP to promote lumbar and thoracic mobility. Pt with good response to LTR, open book and resisted walking this session. He still has pain/stiffness with bed mobility. Pt is progressing towards goals    GOALS: Goals reviewed with patient? Yes  SHORT TERM GOALS: Target date: 05/27/2023    Pt will be independent with initial HEP Baseline: Goal status: MET  2.  Pt will improve 5x STS to <= 13 seconds Baseline:  Goal status: IN PROGRESS   LONG TERM GOALS: Target date: 06/24/2023    Pt will be independent with advanced HEP Baseline:  Goal status: INITIAL  2.  Pt will demo improved functional mobility with FOTO >= 65 Baseline:  Goal status: INITIAL  3.  Pt will demo improved LE strength with 5x STS <= 11 seconds Baseline:  Goal status: INITIAL  4.  Pt will demo improved balance with SLS >= 10 sec bilat Baseline:  Goal status: INITIAL  5.  Pt will return to gym routine without increase in symptoms Baseline:  Goal status: INITIAL  PLAN:  PT FREQUENCY: 2x/week  PT DURATION: 6 weeks  PLANNED INTERVENTIONS: Therapeutic exercises, Therapeutic activity, Neuromuscular re-education, Balance training, Gait training, Patient/Family education, Self Care, Joint mobilization, Aquatic Therapy, Dry Needling, Electrical stimulation,  Cryotherapy, Moist heat, Taping, Ionotophoresis 4mg /ml Dexamethasone, Manual therapy, and Re-evaluation.  PLAN FOR NEXT SESSION:  Add balance and core strength, continue LE strength and trunk mobility   Jsiah Menta, PT 05/27/2023, 2:49 PM

## 2023-05-30 ENCOUNTER — Ambulatory Visit: Payer: No Typology Code available for payment source

## 2023-05-30 DIAGNOSIS — R29898 Other symptoms and signs involving the musculoskeletal system: Secondary | ICD-10-CM

## 2023-05-30 DIAGNOSIS — M6281 Muscle weakness (generalized): Secondary | ICD-10-CM

## 2023-05-30 NOTE — Therapy (Signed)
OUTPATIENT PHYSICAL THERAPY TREATMENT  Patient Name: Jonathon Zimmerman MRN: [id number] DOB:28-Jan-1956, 67 y.o., male Today's Date: 05/30/2023  END OF SESSION:  PT End of Session - 05/30/23 0846     Visit Number 5    Number of Visits 12    Date for PT Re-Evaluation 06/24/23    Authorization Type [facility] 15 visits approved until 11/03/23    Authorization - Visit Number 5    Authorization - Number of Visits 15    Progress Note Due on Visit 15    PT Start Time 0847    PT Stop Time 0930    PT Time Calculation (min) 43 min    Activity Tolerance Patient tolerated treatment well    Behavior During Therapy WFL for tasks assessed/performed            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   Spinal stenosis    Past Surgical History:  Procedure Laterality Date   CERVICAL SPINE SURGERY     COLONOSCOPY     EYE SURGERY Bilateral 2004   Lasix   FRACTURE SURGERY Right 1969   Chipped bone repair on knee   JOINT REPLACEMENT     LUMBAR SPINE SURGERY     REPLACEMENT TOTAL KNEE Right    VASECTOMY  1984   Patient Active Problem List   Diagnosis Date Noted   Lumbar spinal stenosis due to adjacent segment disease after fusion procedure 02/11/2023   Degenerative spondylolisthesis 06/19/2020   Iron deficiency anemia 01/14/2020   Preop cardiovascular exam 01/14/2020   Dysphagia 01/14/2020   Cervical radiculopathy 12/08/2019   Essential hypertension 12/08/2019   Gastroesophageal reflux disease without esophagitis 12/08/2019    PCP: none on file  REFERRING PROVIDER: Julio Sicks  REFERRING DIAG: lumbar spinal stenosis  Rationale for Evaluation and Treatment: Rehabilitation  THERAPY DIAG:   Muscle weakness (generalized)  Other symptoms and signs involving the musculoskeletal system  ONSET DATE: 02/11/2023  SUBJECTIVE:                                                                                                                                                                                           SUBJECTIVE STATEMENT: Patient reports he is feeling stiff today, continues to have 3/10 pain and stiffness in low back down the  legs, R>L.  PERTINENT HISTORY:  Rt total knee replacement 2 x cervical surgery 2 x lumbar surgery  PAIN:  Are you having pain? 3/10 bilat QL to low back  PRECAUTIONS: None  RED FLAGS: None   WEIGHT BEARING RESTRICTIONS: No  FALLS:  Has patient fallen in last 6 months? No   OCCUPATION: retired, enjoys riding his bike, going to the gym  PLOF: Independent  PATIENT GOALS: increase leg strength, be able to stand tall  NEXT MD VISIT: 07/2023  OBJECTIVE:   PATIENT SURVEYS:  FOTO 56   SENSATION: Pt c/o some "burning" in bilat upper thighs and bilat UEs  MUSCLE LENGTH: Hamstrings: Right 50 deg; Left 50 deg  POSTURE: flexed trunk   PALPATION: TTP bilat QL  Increased  muscle spasticity bilat QL, bilat lumbar paraspinals  LUMBAR ROM:   AROM eval  Flexion 50%  Extension 50%  Right lateral flexion 50%  Left lateral flexion 50%  Right rotation 25%  Left rotation 50%   (Blank rows = not tested)   LOWER EXTREMITY MMT:    MMT Right eval Left eval  Hip flexion 3+ 3  Hip extension 3 3-  Hip abduction 3 3  Hip adduction    Hip internal rotation    Hip external rotation    Knee flexion    Knee extension    Ankle dorsiflexion    Ankle plantarflexion    Ankle inversion    Ankle eversion     (Blank rows = not tested)  FUNCTIONAL TESTS:  Rt SLS 4 sec Lt SLS 2 sec 5x STS: 14.96 seconds (eval), 14.52 seconds (10/8)    TODAY'S TREATMENT:                                                                                                                               [facility] Adult PT Treatment:                                                DATE: 05/30/2023 Therapeutic Exercise: NuStep L6 x Figure 4 x10 (B) S/L open books x10 (B) Seated trunk side bend stretch with hand behind head --> added small range trunk rotation Tandem balance 2x30" Tandem stepping  Quarter wall squats x10 Butt kickers (counter) Heel raises x20 Runner's lunge stretch Thred the needle at the counter x5 (B) Manual Therapy: IASTM R QL --> passive alt ribcage/pelvis rotation MWM    [facility] Adult PT Treatment:                                                DATE: 05/27/23 Therapeutic Exercise: Nustep L6 x 5 min LTR 10 x 3 sec hold  Open book x 10 bilat Standing trunk ext against physioball 10 x 10 sec hold Diagonal hip ext red TB 2 x 10 Resisted walking backward 20# x 10 Cat cow at counter x 10 Seated physioball roll outs all directions x 10 5 x STS Manual Therapy: STM lumbar paraspinals, QL bilat PROM bilat hips, passive quad and hip flexor stretch   [facility] Adult PT Treatment:                                                DATE: 05/23/23 Therapeutic Exercise: Nustep L5 x 5 min Physioball roll outs all directions x 10 Standing trunk ext against physioball 10 x 10 sec Standing trunk ext at counter 10 x 3 sec Cat/cow x 10 - cues for form Side step red TB around knees Backward step red TB around knees Mini squat at counter with red TB around knees x 10 Pallof press green TB 2 x 10 bilat Manual Therapy: STM lumbar paraspinals, QL bilat PROM bilat hips, passive quad and hip flexor stretch   PATIENT EDUCATION:  Education details: PT POC and goals, HEP Person educated: Patient Education method: Explanation, Demonstration, and Handouts Education comprehension: verbalized understanding and returned demonstration  HOME EXERCISE PROGRAM: Access Code: [facility] URL: [email or web address] Date: 05/27/2023 Prepared  by: Reggy Eye  Exercises - Supine Lower Trunk Rotation  - 1 x daily - 7 x weekly - 2 sets - 10 reps - Hooklying Single Knee to Chest Stretch  - 1 x daily - 7 x weekly - 1 sets - 3 reps - 20-30 seconds hold - Hip Abduction with Resistance Loop  - 1 x daily - 7 x weekly - 2 sets - 10 reps - Hip Extension with Resistance Loop  - 1 x daily - 7 x weekly - 2 sets - 10 reps - Sit to Stand Without Arm Support  - 1 x daily - 7 x weekly - 2 sets - 10 reps - Sidelying Open Book Thoracic Lumbar Rotation and Extension  - 1 x daily - 7 x weekly - 2 sets - 10 reps - Modified Cat Cow on Counter  - 1 x daily - 7 x weekly - 3 sets - 10 reps  ASSESSMENT:  CLINICAL IMPRESSION: Thoracolumbar mobility exercise continued to progress overall mobility and ease of motion. Balance exercises and NBOS incorporated to challenge postural and dynamic stability. Noted limited dynamic knee flexion during walking hamstring curls. Trunk rotation variations provided to progress postural mobility.   GOALS: Goals reviewed with patient? Yes  SHORT TERM GOALS: Target date: 05/27/2023  Pt will be independent with initial HEP Baseline: Goal status: MET  2.  Pt will improve 5x STS to <= 13 seconds Baseline:  Goal status: IN PROGRESS   LONG TERM GOALS: Target date: 06/24/2023  Pt will be independent with advanced HEP Baseline:  Goal status: INITIAL  2.  Pt will demo improved functional mobility with FOTO >= 65 Baseline: 56% Goal status: IN PROGRESS  3.  Pt will demo improved LE strength with 5x STS <= 11 seconds Baseline:  Goal status: INITIAL  4.  Pt will demo improved balance with SLS >= 10 sec bilat Baseline:  Goal status: INITIAL  5.  Pt will return to gym routine without increase in symptoms Baseline:  Goal status: INITIAL  PLAN:  PT FREQUENCY: 2x/week  PT DURATION:  6 weeks  PLANNED INTERVENTIONS: Therapeutic exercises, Therapeutic activity, Neuromuscular re-education, Balance training, Gait  training, Patient/Family education, Self Care, Joint mobilization, Aquatic Therapy, Dry Needling, Electrical stimulation, Cryotherapy, Moist heat, Taping, Ionotophoresis 4mg /ml Dexamethasone, Manual therapy, and Re-evaluation.  PLAN FOR NEXT SESSION:  Continue balance and core strength, continue LE strength and trunk mobility; progress exercise routine to return to gym.   Sanjuana Mae, PTA 05/30/2023, 9:32 AM

## 2023-06-03 ENCOUNTER — Encounter: Payer: Self-pay | Admitting: Physical Therapy

## 2023-06-03 ENCOUNTER — Ambulatory Visit: Payer: No Typology Code available for payment source | Admitting: Physical Therapy

## 2023-06-03 DIAGNOSIS — M6281 Muscle weakness (generalized): Secondary | ICD-10-CM

## 2023-06-03 DIAGNOSIS — R29898 Other symptoms and signs involving the musculoskeletal system: Secondary | ICD-10-CM

## 2023-06-03 NOTE — Therapy (Signed)
OUTPATIENT PHYSICAL THERAPY TREATMENT  Patient Name: Jonathon Zimmerman MRN: [id number] DOB:1956-01-17, 67 y.o., male Today's Date: 06/03/2023  END OF SESSION:  PT End of Session - 06/03/23 1442     Visit Number 6    Number of Visits 12    Date for PT Re-Evaluation 06/24/23    Authorization Type [facility] 15 visits approved until 11/03/23    Authorization - Visit Number 6    Authorization - Number of Visits 15    PT Start Time 1400    PT Stop Time 1442    PT Time Calculation (min) 42 min    Activity Tolerance Patient tolerated treatment well    Behavior During Therapy WFL for tasks assessed/performed             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   Spinal stenosis    Past Surgical History:  Procedure Laterality Date   CERVICAL SPINE SURGERY     COLONOSCOPY     EYE SURGERY Bilateral 2004   Lasix   FRACTURE SURGERY Right 1969   Chipped bone repair on knee   JOINT REPLACEMENT     LUMBAR SPINE SURGERY     REPLACEMENT TOTAL KNEE Right    VASECTOMY  1984   Patient Active Problem List   Diagnosis Date Noted   Lumbar spinal stenosis due to adjacent segment disease after fusion procedure 02/11/2023   Degenerative spondylolisthesis 06/19/2020   Iron deficiency anemia 01/14/2020   Preop cardiovascular exam 01/14/2020   Dysphagia 01/14/2020   Cervical radiculopathy 12/08/2019   Essential hypertension 12/08/2019   Gastroesophageal reflux disease without esophagitis 12/08/2019    PCP: none on file  REFERRING PROVIDER: Julio Sicks  REFERRING DIAG: lumbar spinal stenosis  Rationale for Evaluation and Treatment: Rehabilitation  THERAPY DIAG:  Muscle weakness  (generalized)  Other symptoms and signs involving the musculoskeletal system  ONSET DATE: 02/11/2023  SUBJECTIVE:                                                                                                                                                                                           SUBJECTIVE STATEMENT: Patient reports he continues to feel "stiff". He states he gets more stiff in the colder weather  PERTINENT HISTORY:  Rt total knee replacement 2 x  cervical surgery 2 x lumbar surgery  PAIN:  Are you having pain? 3/10 bilat QL to low back  PRECAUTIONS: None  RED FLAGS: None   WEIGHT BEARING RESTRICTIONS: No  FALLS:  Has patient fallen in last 6 months? No   OCCUPATION: retired, enjoys riding his bike, going to the gym  PLOF: Independent  PATIENT GOALS: increase leg strength, be able to stand tall  NEXT MD VISIT: 07/2023  OBJECTIVE:   PATIENT SURVEYS:  FOTO 56   SENSATION: Pt c/o some "burning" in bilat upper thighs and bilat UEs  MUSCLE LENGTH: Hamstrings: Right 50 deg; Left 50 deg  POSTURE: flexed trunk   PALPATION: TTP bilat QL  Increased  muscle spasticity bilat QL, bilat lumbar paraspinals  LUMBAR ROM:   AROM eval 10/15  Flexion 50% 75%  Extension 50% 75%  Right lateral flexion 50% 75%  Left lateral flexion 50% 75%  Right rotation 25% 50%  Left rotation 50% 50%   (Blank rows = not tested)   LOWER EXTREMITY MMT:    MMT Right eval Left eval  Hip flexion 3+ 3  Hip extension 3 3-  Hip abduction 3 3  Hip adduction    Hip internal rotation    Hip external rotation    Knee flexion    Knee extension    Ankle dorsiflexion    Ankle plantarflexion    Ankle inversion    Ankle eversion     (Blank rows = not tested)  FUNCTIONAL TESTS:  Rt SLS 4 sec Lt SLS 2 sec 5x STS: 14.96 seconds (eval), 14.52 seconds (10/8)    TODAY'S TREATMENT:                                                                                                                               [facility] Adult PT Treatment:                                                DATE: 06/03/23 Therapeutic Exercise: Nustep L6 x 5 min ROM measurements (see above) Seated figure 4 stretch bilat LTR 10 x 3 sec hold SLR x 10 bilat - focus on lower ab engagement SLR with hip abd/add lifting foot over obstacle x 10 bilat Open book x 10 bilat red TB Seated dead lift 15# x 10 J curl x 10 Butt kickers at counter High knees at counter Runners lunge stretch at counter Seated pelvic tilt 2 x 10   [facility] Adult PT Treatment:                                                DATE: 05/30/2023 Therapeutic Exercise: NuStep L6 x Figure 4  x10 (B) S/L open books x10 (B) Seated trunk side bend stretch with hand behind head --> added small range trunk rotation Tandem balance 2x30" Tandem stepping  Quarter wall squats x10 Butt kickers (counter) Heel raises x20 Runner's lunge stretch Thred the needle at the counter x5 (B) Manual Therapy: IASTM R QL --> passive alt ribcage/pelvis rotation MWM    [facility] Adult PT Treatment:                                                DATE: 05/27/23 Therapeutic Exercise: Nustep L6 x 5 min LTR 10 x 3 sec hold Open book x 10 bilat Standing trunk ext against physioball 10 x 10 sec hold Diagonal hip ext red TB 2 x 10 Resisted walking backward 20# x 10 Cat cow at counter x 10 Seated physioball roll outs all directions x 10 5 x STS Manual Therapy: STM lumbar paraspinals, QL bilat PROM bilat hips, passive quad and hip flexor stretch   PATIENT EDUCATION:  Education details: PT POC and goals, HEP Person educated: Patient Education method: Explanation, Demonstration, and Handouts Education comprehension: verbalized understanding and returned demonstration  HOME EXERCISE PROGRAM: Access Code: [facility] URL: [email or web address] Date: 06/03/2023 Prepared by: Reggy Eye  Exercises - Supine Lower Trunk Rotation   - 1 x daily - 7 x weekly - 2 sets - 10 reps - Hooklying Single Knee to Chest Stretch  - 1 x daily - 7 x weekly - 1 sets - 3 reps - 20-30 seconds hold - Sit to Stand Without Arm Support  - 1 x daily - 7 x weekly - 2 sets - 10 reps - Sidelying Open Book Thoracic Lumbar Rotation and Extension  - 1 x daily - 7 x weekly - 2 sets - 10 reps - Modified Cat Cow on Counter  - 1 x daily - 7 x weekly - 3 sets - 10 reps - Seated Pelvic Tilt  - 1 x daily - 7 x weekly - 3 sets - 10 reps - Supine Pelvic Tilt with Straight Leg Raise  - 1 x daily - 7 x weekly - 3 sets - 10 reps - Barbell Jefferson Curl  - 1 x daily - 7 x weekly - 3 sets - 10 reps  ASSESSMENT:  CLINICAL IMPRESSION: Updated HEP to progress lower abdominal strength and trunk mobility. Pt with improved trunk ROM, he continues with decreased lower abdominal strength limiting progress with pain free bed mobility. He tolerated all interventions today without increased symptoms   GOALS: Goals reviewed with patient? Yes  SHORT TERM GOALS: Target date: 05/27/2023  Pt will be independent with initial HEP Baseline: Goal status: MET  2.  Pt will improve 5x STS to <= 13 seconds Baseline:  Goal status: IN PROGRESS   LONG TERM GOALS: Target date: 06/24/2023  Pt will be independent with advanced HEP Baseline:  Goal status: INITIAL  2.  Pt will demo improved functional mobility with FOTO >= 65 Baseline: 56% Goal status: IN PROGRESS  3.  Pt will demo improved LE strength with 5x STS <= 11 seconds Baseline:  Goal status: INITIAL  4.  Pt will demo improved balance with SLS >= 10 sec bilat Baseline:  Goal status: INITIAL  5.  Pt will return to gym routine without increase in symptoms Baseline:  Goal status: INITIAL  PLAN:  PT FREQUENCY:  2x/week  PT DURATION: 6 weeks  PLANNED INTERVENTIONS: Therapeutic exercises, Therapeutic activity, Neuromuscular re-education, Balance training, Gait training, Patient/Family education, Self Care,  Joint mobilization, Aquatic Therapy, Dry Needling, Electrical stimulation, Cryotherapy, Moist heat, Taping, Ionotophoresis 4mg /ml Dexamethasone, Manual therapy, and Re-evaluation.  PLAN FOR NEXT SESSION:  Continue balance and core strength, continue LE strength and trunk mobility; progress exercise routine to return to gym.   Lilley Hubble, PT 06/03/2023, 2:43 PM

## 2023-06-06 ENCOUNTER — Ambulatory Visit: Payer: No Typology Code available for payment source | Admitting: Physical Therapy

## 2023-06-10 ENCOUNTER — Ambulatory Visit: Payer: No Typology Code available for payment source

## 2023-06-10 DIAGNOSIS — M6281 Muscle weakness (generalized): Secondary | ICD-10-CM

## 2023-06-10 DIAGNOSIS — R29898 Other symptoms and signs involving the musculoskeletal system: Secondary | ICD-10-CM

## 2023-06-10 NOTE — Therapy (Signed)
OUTPATIENT PHYSICAL THERAPY TREATMENT  Patient Name: Jonathon Zimmerman MRN: [id number] DOB:1956-07-18, 67 y.o., male Today's Date: 06/10/2023  END OF SESSION:  PT End of Session - 06/10/23 0844     Visit Number 7    Number of Visits 12    Date for PT Re-Evaluation 06/24/23    Authorization Type [facility] 15 visits approved until 11/03/23    Authorization - Visit Number 7    Authorization - Number of Visits 15    Progress Note Due on Visit 15    PT Start Time 0845    PT Stop Time 0928    PT Time Calculation (min) 43 min    Activity Tolerance Patient tolerated treatment well    Behavior During Therapy [facility] for tasks assessed/performed             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   Spinal stenosis    Past Surgical History:  Procedure Laterality Date   CERVICAL SPINE SURGERY     COLONOSCOPY     EYE SURGERY Bilateral 2004   Lasix   FRACTURE SURGERY Right 1969   Chipped bone repair on knee   JOINT REPLACEMENT     LUMBAR SPINE SURGERY     REPLACEMENT TOTAL KNEE Right    VASECTOMY  1984   Patient Active Problem List   Diagnosis Date Noted   Lumbar spinal stenosis due to adjacent segment disease after fusion procedure 02/11/2023   Degenerative spondylolisthesis 06/19/2020   Iron deficiency anemia 01/14/2020   Preop cardiovascular exam 01/14/2020   Dysphagia 01/14/2020   Cervical radiculopathy 12/08/2019   Essential hypertension 12/08/2019   Gastroesophageal reflux disease without esophagitis 12/08/2019    PCP: none on file  REFERRING PROVIDER: Julio Sicks  REFERRING DIAG: lumbar spinal stenosis  Rationale for Evaluation and Treatment: Rehabilitation  THERAPY DIAG:   Muscle weakness (generalized)  Other symptoms and signs involving the musculoskeletal system  ONSET DATE: 02/11/2023  SUBJECTIVE:                                                                                                                                                                                           SUBJECTIVE STATEMENT: Patient reports he is not feeling as stiff today; [place] and is still sore after it.  PERTINENT HISTORY:  Rt total knee replacement 2 x cervical surgery 2 x lumbar surgery  PAIN:  Are you having pain? 3/10 bilat QL to low back  PRECAUTIONS: None  RED FLAGS: None   WEIGHT BEARING RESTRICTIONS: No  FALLS:  Has patient fallen in last 6 months? No   OCCUPATION: retired, enjoys riding his bike, going to the gym  PLOF: Independent  PATIENT GOALS: increase leg strength, be able to stand tall  NEXT MD VISIT: 07/2023  OBJECTIVE:   PATIENT SURVEYS:  FOTO 56   SENSATION: Pt c/o some "burning" in bilat upper thighs and bilat UEs  MUSCLE LENGTH: Hamstrings: Right 50 deg; Left 50 deg  POSTURE: flexed trunk   PALPATION: TTP bilat QL  Increased  muscle spasticity bilat QL, bilat lumbar paraspinals  LUMBAR ROM:   AROM eval 10/15  Flexion 50% 75%  Extension 50% 75%  Right lateral flexion 50% 75%  Left lateral flexion 50% 75%  Right rotation 25% 50%  Left rotation 50% 50%   (Blank rows = not tested)   LOWER EXTREMITY MMT:    MMT Right eval Left eval  Hip flexion 3+ 3  Hip extension 3 3-  Hip abduction 3 3  Hip adduction    Hip internal rotation    Hip external rotation    Knee flexion    Knee extension    Ankle dorsiflexion    Ankle plantarflexion    Ankle inversion    Ankle eversion     (Blank rows = not tested)  FUNCTIONAL TESTS:  Rt SLS 4 sec Lt SLS 2 sec 5x STS: 14.96 seconds (eval), 14.52 seconds (10/8)    TODAY'S TREATMENT:                                                                                                                               [facility] Adult PT Treatment:                                                DATE: 06/10/2023 Therapeutic Exercise: Recumbent bike L3 x Leg press DL 161# W96 --> 045# W09 --> SL 65# 2x10 (B) Seated HS stretch (foot on low step) 3x10" (B) Seated figure 4 stretch x4 (B) --> focus on not slumping Figure 4 LTR x10 (B) Prone:  Spinal extension (small range, hands underneath shoulders) x10 Hip extension: straight leg x10 --> bent knee x10 (B) Quadruped thred the needle with hand behind head x5 (B) Incline plank holds on low mat table 3x10" Supported standing 1/2 roll down at wall with 2#DB   [facility] Adult PT Treatment:  DATE: 06/03/23 Therapeutic Exercise: Nustep L6 x 5 min ROM measurements (see above) Seated figure 4 stretch bilat LTR 10 x 3 sec hold SLR x 10 bilat - focus on lower ab engagement SLR with hip abd/add lifting foot over obstacle x 10 bilat Open book x 10 bilat red TB Seated dead lift 15# x 10 J curl x 10 Butt kickers at counter High knees at counter Runners lunge stretch at counter Seated pelvic tilt 2 x 10   [facility] Adult PT Treatment:                                                DATE: 05/30/2023 Therapeutic Exercise: NuStep L6 x Figure 4 x10 (B) S/L open books x10 (B) Seated trunk side bend stretch with hand behind head --> added small range trunk rotation Tandem balance 2x30" Tandem stepping  Quarter wall squats x10 Butt kickers (counter) Heel raises x20 Runner's lunge stretch Thred the needle at the counter x5 (B) Manual Therapy: IASTM R QL --> passive alt ribcage/pelvis rotation MWM   PATIENT EDUCATION:  Education details: Updated PT  Person educated: Patient Education method: Explanation, Demonstration, and Handouts Education comprehension: verbalized understanding and returned demonstration  HOME EXERCISE  PROGRAM: Access Code: [facility] URL: [email or web address] Date: 06/03/2023 Prepared by: Reggy Eye  Exercises - Supine Lower Trunk Rotation  - 1 x daily - 7 x weekly - 2 sets - 10 reps - Hooklying Single Knee to Chest Stretch  - 1 x daily - 7 x weekly - 1 sets - 3 reps - 20-30 seconds hold - Sit to Stand Without Arm Support  - 1 x daily - 7 x weekly - 2 sets - 10 reps - Sidelying Open Book Thoracic Lumbar Rotation and Extension  - 1 x daily - 7 x weekly - 2 sets - 10 reps - Modified Cat Cow on Counter  - 1 x daily - 7 x weekly - 3 sets - 10 reps - Seated Pelvic Tilt  - 1 x daily - 7 x weekly - 3 sets - 10 reps - Supine Pelvic Tilt with Straight Leg Raise  - 1 x daily - 7 x weekly - 3 sets - 10 reps - Barbell Jefferson Curl  - 1 x daily - 7 x weekly - 3 sets - 10 reps  ASSESSMENT:  CLINICAL IMPRESSION:  Leg press incorporated to progress LE strength and prepare patient for return to gym. Cueing provided to improve postural alignment and lengthening during seated figure 4 stretch; tactile cues needed to maintain alignment cues.    GOALS: Goals reviewed with patient? Yes  SHORT TERM GOALS: Target date: 05/27/2023  Pt will be independent with initial HEP Baseline: Goal status: MET  2.  Pt will improve 5x STS to <= 13 seconds Baseline:  Goal status: IN PROGRESS   LONG TERM GOALS: Target date: 06/24/2023  Pt will be independent with advanced HEP Baseline:  Goal status: INITIAL  2.  Pt will demo improved functional mobility with FOTO >= 65 Baseline: 56% Goal status: IN PROGRESS  3.  Pt will demo improved LE strength with 5x STS <= 11 seconds Baseline:  Goal status: INITIAL  4.  Pt will demo improved balance with SLS >= 10 sec bilat Baseline:  Goal status: INITIAL  5.  Pt will return to gym routine without increase in symptoms  Baseline:  Goal status: INITIAL  PLAN:  PT FREQUENCY: 2x/week  PT DURATION: 6 weeks  PLANNED INTERVENTIONS: Therapeutic  exercises, Therapeutic activity, Neuromuscular re-education, Balance training, Gait training, Patient/Family education, Self Care, Joint mobilization, Aquatic Therapy, Dry Needling, Electrical stimulation, Cryotherapy, Moist heat, Taping, Ionotophoresis 4mg /ml Dexamethasone, Manual therapy, and Re-evaluation.  PLAN FOR NEXT SESSION:  Continue balance and core strength, continue LE strength and trunk mobility; progress exercise routine to return to gym.   Sanjuana Mae, PTA 06/10/2023, 9:30 AM

## 2023-06-13 ENCOUNTER — Ambulatory Visit: Payer: No Typology Code available for payment source | Admitting: Physical Therapy

## 2023-06-13 ENCOUNTER — Encounter: Payer: Self-pay | Admitting: Physical Therapy

## 2023-06-13 DIAGNOSIS — M6281 Muscle weakness (generalized): Secondary | ICD-10-CM

## 2023-06-13 DIAGNOSIS — R29898 Other symptoms and signs involving the musculoskeletal system: Secondary | ICD-10-CM

## 2023-06-13 NOTE — Therapy (Signed)
OUTPATIENT PHYSICAL THERAPY TREATMENT  Patient Name: Jonathon Zimmerman MRN: [id number] DOB:[birth year], 67 y.o., male Today's Date: 06/13/2023  END OF SESSION:  PT End of Session - 06/13/23 0927     Visit Number 8    Number of Visits 12    Date for PT Re-Evaluation 06/24/23    Authorization Type [facility] 15 visits approved until 11/03/23    Authorization - Visit Number 8    Authorization - Number of Visits 15    Progress Note Due on Visit 15    PT Start Time 0850    PT Stop Time 0928    PT Time Calculation (min) 38 min    Activity Tolerance Patient tolerated treatment well    Behavior During Therapy [facility] for tasks assessed/performed              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   Spinal stenosis    Past Surgical History:  Procedure Laterality Date   CERVICAL SPINE SURGERY     COLONOSCOPY     EYE SURGERY Bilateral 2004   Lasix   FRACTURE SURGERY Right 1969   Chipped bone repair on knee   JOINT REPLACEMENT     LUMBAR SPINE SURGERY     REPLACEMENT TOTAL KNEE Right    VASECTOMY  1984   Patient Active Problem List   Diagnosis Date Noted   Lumbar spinal stenosis due to adjacent segment disease after fusion procedure 02/11/2023   Degenerative spondylolisthesis 06/19/2020   Iron deficiency anemia 01/14/2020   Preop cardiovascular exam 01/14/2020   Dysphagia 01/14/2020   Cervical radiculopathy 12/08/2019   Essential hypertension 12/08/2019   Gastroesophageal reflux disease without esophagitis 12/08/2019    PCP: none on file  REFERRING PROVIDER: Julio Sicks  REFERRING DIAG: lumbar spinal stenosis  Rationale for Evaluation and Treatment: Rehabilitation  THERAPY DIAG:   Muscle weakness (generalized)  Other symptoms and signs involving the musculoskeletal system  ONSET DATE: 02/11/2023  SUBJECTIVE:                                                                                                                                                                                           SUBJECTIVE STATEMENT: Patient reports he did a water exercise class a few days ago and is feeling sore from  that  PERTINENT HISTORY:  Rt total knee replacement 2 x cervical surgery 2 x lumbar surgery  PAIN:  Are you having pain? 6/10 bilat QL to low back  PRECAUTIONS: None  RED FLAGS: None   WEIGHT BEARING RESTRICTIONS: No  FALLS:  Has patient fallen in last 6 months? No   OCCUPATION: retired, enjoys riding his bike, going to the gym  PLOF: Independent  PATIENT GOALS: increase leg strength, be able to stand tall  NEXT MD VISIT: 07/2023  OBJECTIVE:   PATIENT SURVEYS:  FOTO 56   SENSATION: Pt c/o some "burning" in bilat upper thighs and bilat UEs  MUSCLE LENGTH: Hamstrings: Right 50 deg; Left 50 deg  POSTURE: flexed trunk   PALPATION: TTP bilat QL  Increased  muscle spasticity bilat QL, bilat lumbar paraspinals  LUMBAR ROM:   AROM eval 10/15  Flexion 50% 75%  Extension 50% 75%  Right lateral flexion 50% 75%  Left lateral flexion 50% 75%  Right rotation 25% 50%  Left rotation 50% 50%   (Blank rows = not tested)   LOWER EXTREMITY MMT:    MMT Right eval Left eval Left 10/25  Hip flexion 3+ 3 3+  Hip extension 3 3- 3  Hip abduction 3 3 3+  Hip adduction     Hip internal rotation     Hip external rotation     Knee flexion     Knee extension     Ankle dorsiflexion     Ankle plantarflexion     Ankle inversion     Ankle eversion      (Blank rows = not tested)  FUNCTIONAL TESTS:  Rt SLS 4 sec Lt SLS 2 sec 5x STS: 14.96 seconds (eval), 14.52 seconds (10/8)    TODAY'S TREATMENT:                                                                                                                               [facility] Adult PT Treatment:                                                DATE: 06/13/23 Therapeutic Exercise: Nustep L7 x 5 min Supported standing 1/2 roll down at wall with 5#DB bilat Tandem stance on foam 2 x 30 sec SLS 2 x 30 sec bilat Leg press DL 161# 2 x 10 Seated dead lift 15# 2 x 10 Sit <> stand 15# KB 2 x 10 Figure 4 LTR x 10 bilat Quadruped thread the needle x 10 bilat Seated pelvic tilt x 20 Prone Small range spinal extension hands under shoulders x 10 Hip extension x 10 bilat   [facility] Adult PT Treatment:  DATE: 06/10/2023 Therapeutic Exercise: Recumbent bike L3 x Leg press DL 272# Z36 --> 644# I34 --> SL 65# 2x10 (B) Seated HS stretch (foot on low step) 3x10" (B) Seated figure 4 stretch x4 (B) --> focus on not slumping Figure 4 LTR x10 (B) Prone:  Spinal extension (small range, hands underneath shoulders) x10 Hip extension: straight leg x10 --> bent knee x10 (B) Quadruped thred the needle with hand behind head x5 (B) Incline plank holds on low mat table 3x10" Supported standing 1/2 roll down at wall with 2#DB   [facility] Adult PT Treatment:                                                DATE: 06/03/23 Therapeutic Exercise: Nustep L6 x 5 min ROM measurements (see above) Seated figure 4 stretch bilat LTR 10 x 3 sec hold SLR x 10 bilat - focus on lower ab engagement SLR with hip abd/add lifting foot over obstacle x 10 bilat Open book x 10 bilat red TB Seated dead lift 15# x 10 J curl x 10 Butt kickers at counter High knees at counter Runners lunge stretch at counter Seated pelvic tilt 2 x 10  PATIENT EDUCATION:  Education details: Updated PT  Person educated: Patient Education method: Explanation, Facilities manager, and Handouts Education comprehension: verbalized understanding and returned demonstration  HOME EXERCISE PROGRAM: Access  Code: [facility] URL: [email or web address] Date: 06/03/2023 Prepared by: Reggy Eye  Exercises - Supine Lower Trunk Rotation  - 1 x daily - 7 x weekly - 2 sets - 10 reps - Hooklying Single Knee to Chest Stretch  - 1 x daily - 7 x weekly - 1 sets - 3 reps - 20-30 seconds hold - Sit to Stand Without Arm Support  - 1 x daily - 7 x weekly - 2 sets - 10 reps - Sidelying Open Book Thoracic Lumbar Rotation and Extension  - 1 x daily - 7 x weekly - 2 sets - 10 reps - Modified Cat Cow on Counter  - 1 x daily - 7 x weekly - 3 sets - 10 reps - Seated Pelvic Tilt  - 1 x daily - 7 x weekly - 3 sets - 10 reps - Supine Pelvic Tilt with Straight Leg Raise  - 1 x daily - 7 x weekly - 3 sets - 10 reps - Barbell Jefferson Curl  - 1 x daily - 7 x weekly - 3 sets - 10 reps  ASSESSMENT:  CLINICAL IMPRESSION: Pt improving LT LE strength. He continues with c/o Rt QL tightness but responds well to stretching exercises. Plan of focus on balance, strength and continued trunk mobility to work towards long term goals   GOALS: Goals reviewed with patient? Yes  SHORT TERM GOALS: Target date: 05/27/2023  Pt will be independent with initial HEP Baseline: Goal status: MET  2.  Pt will improve 5x STS to <= 13 seconds Baseline:  Goal status: IN PROGRESS   LONG TERM GOALS: Target date: 06/24/2023  Pt will be independent with advanced HEP Baseline:  Goal status: INITIAL  2.  Pt will demo improved functional mobility with FOTO >= 65 Baseline: 56% Goal status: IN PROGRESS  3.  Pt will demo improved LE strength with 5x STS <= 11 seconds Baseline:  Goal status: INITIAL  4.  Pt will demo improved balance with SLS >=  10 sec bilat Baseline:  Goal status: INITIAL  5.  Pt will return to gym routine without increase in symptoms Baseline:  Goal status: INITIAL  PLAN:  PT FREQUENCY: 2x/week  PT DURATION: 6 weeks  PLANNED INTERVENTIONS: Therapeutic exercises, Therapeutic activity,  Neuromuscular re-education, Balance training, Gait training, Patient/Family education, Self Care, Joint mobilization, Aquatic Therapy, Dry Needling, Electrical stimulation, Cryotherapy, Moist heat, Taping, Ionotophoresis 4mg /ml Dexamethasone, Manual therapy, and Re-evaluation.  PLAN FOR NEXT SESSION:  Continue balance and core strength, continue LE strength and trunk mobility; progress exercise routine to return to gym.   Haeven Nickle, PT 06/13/2023, 9:28 AM

## 2023-06-17 ENCOUNTER — Ambulatory Visit: Payer: No Typology Code available for payment source | Admitting: Physical Therapy

## 2023-06-17 ENCOUNTER — Encounter: Payer: Self-pay | Admitting: Physical Therapy

## 2023-06-17 DIAGNOSIS — M6281 Muscle weakness (generalized): Secondary | ICD-10-CM

## 2023-06-17 DIAGNOSIS — R29898 Other symptoms and signs involving the musculoskeletal system: Secondary | ICD-10-CM

## 2023-06-17 NOTE — Therapy (Signed)
OUTPATIENT PHYSICAL THERAPY TREATMENT  Patient Name: Jonathon Zimmerman MRN: [id number] DOB:[birth year], 67 y.o., male Today's Date: 06/17/2023  END OF SESSION:  PT End of Session - 06/17/23 0927     Visit Number 9    Number of Visits 12    Date for PT Re-Evaluation 06/24/23    Authorization Type [facility] 15 visits approved until 11/03/23    Authorization - Visit Number 9    Progress Note Due on Visit 15    PT Start Time 0848    PT Stop Time 0928    PT Time Calculation (min) 40 min    Activity Tolerance Patient tolerated treatment well    Behavior During Therapy [facility] for tasks assessed/performed               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   Spinal stenosis    Past Surgical History:  Procedure Laterality Date   CERVICAL SPINE SURGERY     COLONOSCOPY     EYE SURGERY Bilateral 2004   Lasix   FRACTURE SURGERY Right 1969   Chipped bone repair on knee   JOINT REPLACEMENT     LUMBAR SPINE SURGERY     REPLACEMENT TOTAL KNEE Right    VASECTOMY  1984   Patient Active Problem List   Diagnosis Date Noted   Lumbar spinal stenosis due to adjacent segment disease after fusion procedure 02/11/2023   Degenerative spondylolisthesis 06/19/2020   Iron deficiency anemia 01/14/2020   Preop cardiovascular exam 01/14/2020   Dysphagia 01/14/2020   Cervical radiculopathy 12/08/2019   Essential hypertension 12/08/2019   Gastroesophageal reflux disease without esophagitis 12/08/2019    PCP: none on file  REFERRING PROVIDER: Julio Sicks  REFERRING DIAG: lumbar spinal stenosis  Rationale for Evaluation and Treatment: Rehabilitation  THERAPY DIAG:  Muscle weakness  (generalized)  Other symptoms and signs involving the musculoskeletal system  ONSET DATE: 02/11/2023  SUBJECTIVE:                                                                                                                                                                                           SUBJECTIVE STATEMENT: Patient reports he was very sore over the weekend, he is feeling a bit better today  PERTINENT HISTORY:  Rt total knee replacement 2  x cervical surgery 2 x lumbar surgery  PAIN:  Are you having pain? 5/10 bilat QL to low back  PRECAUTIONS: None  RED FLAGS: None   WEIGHT BEARING RESTRICTIONS: No  FALLS:  Has patient fallen in last 6 months? No   OCCUPATION: retired, enjoys riding his bike, going to the gym  PLOF: Independent  PATIENT GOALS: increase leg strength, be able to stand tall  NEXT MD VISIT: 07/2023  OBJECTIVE:   PATIENT SURVEYS:  FOTO 56   SENSATION: Pt c/o some "burning" in bilat upper thighs and bilat UEs  MUSCLE LENGTH: Hamstrings: Right 50 deg; Left 50 deg  POSTURE: flexed trunk   PALPATION: TTP bilat QL  Increased  muscle spasticity bilat QL, bilat lumbar paraspinals  LUMBAR ROM:   AROM eval 10/15  Flexion 50% 75%  Extension 50% 75%  Right lateral flexion 50% 75%  Left lateral flexion 50% 75%  Right rotation 25% 50%  Left rotation 50% 50%   (Blank rows = not tested)   LOWER EXTREMITY MMT:    MMT Right eval Left eval Left 10/25  Hip flexion 3+ 3 3+  Hip extension 3 3- 3  Hip abduction 3 3 3+  Hip adduction     Hip internal rotation     Hip external rotation     Knee flexion     Knee extension     Ankle dorsiflexion     Ankle plantarflexion     Ankle inversion     Ankle eversion      (Blank rows = not tested)  FUNCTIONAL TESTS:  Rt SLS 4 sec (eval)  7 sec (10/29) Lt SLS 2 sec (eval) 10 (10/29) 5x STS: 14.96 seconds (eval), 14.52 seconds (10/8), 9.9 seconds (10/29)    TODAY'S TREATMENT:                                                                                                                               [facility] Adult PT Treatment:                                                DATE: 06/17/23 Therapeutic Exercise: Nustep L7 x 5 min J curl no resistance x 10 - focus on spinal mobility Tandem stance on foam 2 x 30 sec bilat Narrow BOS on foam with KB swing 2 x 30 sec bilat Resisted walking backwards/forwards 15# x 10 Resisted walking sideways 15# x 7 bilat SLS trials bilat without UE support Seated figure 4 stretch 2 x 30 sec 5x STS 9.9 seconds Physioball roll outs focus on lateral stretching Pelvic tilt seated x 20 FOTO 61   [facility] Adult PT Treatment:  DATE: 06/13/23 Therapeutic Exercise: Nustep L7 x 5 min Supported standing 1/2 roll down at wall with 5#DB bilat Tandem stance on foam 2 x 30 sec SLS 2 x 30 sec bilat Leg press DL 846# 2 x 10 Seated dead lift 15# 2 x 10 Sit <> stand 15# KB 2 x 10 Figure 4 LTR x 10 bilat Quadruped thread the needle x 10 bilat Seated pelvic tilt x 20 Prone Small range spinal extension hands under shoulders x 10 Hip extension x 10 bilat   [facility] Adult PT Treatment:                                                DATE: 06/10/2023 Therapeutic Exercise: Recumbent bike L3 x Leg press DL 962# X52 --> 841# L24 --> SL 65# 2x10 (B) Seated HS stretch (foot on low step) 3x10" (B) Seated figure 4 stretch x4 (B) --> focus on not slumping Figure 4 LTR x10 (B) Prone:  Spinal extension (small range, hands underneath shoulders) x10 Hip extension: straight leg x10 --> bent knee x10 (B) Quadruped thred the needle with hand behind head x5 (B) Incline plank holds on low mat table 3x10" Supported standing 1/2 roll down at wall with 2#DB  PATIENT EDUCATION:  Education details: Updated PT  Person educated: Patient Education method: Explanation, Demonstration, and Handouts Education comprehension: verbalized  understanding and returned demonstration  HOME EXERCISE PROGRAM: Access Code: [facility] URL: [email or web address] Date: 06/03/2023 Prepared by: Reggy Eye  Exercises - Supine Lower Trunk Rotation  - 1 x daily - 7 x weekly - 2 sets - 10 reps - Hooklying Single Knee to Chest Stretch  - 1 x daily - 7 x weekly - 1 sets - 3 reps - 20-30 seconds hold - Sit to Stand Without Arm Support  - 1 x daily - 7 x weekly - 2 sets - 10 reps - Sidelying Open Book Thoracic Lumbar Rotation and Extension  - 1 x daily - 7 x weekly - 2 sets - 10 reps - Modified Cat Cow on Counter  - 1 x daily - 7 x weekly - 3 sets - 10 reps - Seated Pelvic Tilt  - 1 x daily - 7 x weekly - 3 sets - 10 reps - Supine Pelvic Tilt with Straight Leg Raise  - 1 x daily - 7 x weekly - 3 sets - 10 reps - Barbell Jefferson Curl  - 1 x daily - 7 x weekly - 3 sets - 10 reps  ASSESSMENT:  CLINICAL IMPRESSION: Pt with good tolerance to progression of balance exercises, he has improved SLS time bilat. He met 5x STS goal and is progressing towards FOTO goal. He continues with pain in bilat QL and low back, he will follow up with MD next month   GOALS: Goals reviewed with patient? Yes  SHORT TERM GOALS: Target date: 05/27/2023  Pt will be independent with initial HEP Baseline: Goal status: MET  2.  Pt will improve 5x STS to <= 13 seconds Baseline:  Goal status: MET   LONG TERM GOALS: Target date: 06/24/2023  Pt will be independent with advanced HEP Baseline:  Goal status: INITIAL  2.  Pt will demo improved functional mobility with FOTO >= 65 Baseline: 56% Goal status: IN PROGRESS  3.  Pt will demo improved LE strength with 5x STS <= 11 seconds  Baseline:  Goal status: MET  4.  Pt will demo improved balance with SLS >= 10 sec bilat Baseline:  Goal status: IN PROGRESS  5.  Pt will return to gym routine without increase in symptoms Baseline:  Goal status: IN PROGRESS  PLAN:  PT FREQUENCY:  2x/week  PT DURATION: 6 weeks  PLANNED INTERVENTIONS: Therapeutic exercises, Therapeutic activity, Neuromuscular re-education, Balance training, Gait training, Patient/Family education, Self Care, Joint mobilization, Aquatic Therapy, Dry Needling, Electrical stimulation, Cryotherapy, Moist heat, Taping, Ionotophoresis 4mg /ml Dexamethasone, Manual therapy, and Re-evaluation.  PLAN FOR NEXT SESSION:  Continue balance and core strength, continue LE strength and trunk mobility; progress exercise routine to return to gym.   Eirik Schueler, PT 06/17/2023, 9:28 AM

## 2023-06-20 ENCOUNTER — Ambulatory Visit: Payer: No Typology Code available for payment source | Attending: Neurosurgery

## 2023-06-20 DIAGNOSIS — R29898 Other symptoms and signs involving the musculoskeletal system: Secondary | ICD-10-CM | POA: Diagnosis present

## 2023-06-20 DIAGNOSIS — M6281 Muscle weakness (generalized): Secondary | ICD-10-CM | POA: Diagnosis present

## 2023-06-20 NOTE — Therapy (Signed)
OUTPATIENT PHYSICAL THERAPY TREATMENT  Patient Name: Jonathon Zimmerman MRN: [id number] DOB:1955-12-23, 67 y.o., male Today's Date: 06/20/2023  END OF SESSION:  PT End of Session - 06/20/23 0903     Visit Number 10    Number of Visits 12    Date for PT Re-Evaluation 06/24/23    Authorization Type [facility] 15 visits approved until 11/03/23    Authorization - Visit Number 10    Authorization - Number of Visits 15    Progress Note Due on Visit 15    PT Start Time 0903    PT Stop Time 0945    PT Time Calculation (min) 42 min    Activity Tolerance Patient tolerated treatment well    Behavior During Therapy [facility] for tasks assessed/performed            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   Spinal stenosis    Past Surgical History:  Procedure Laterality Date   CERVICAL SPINE SURGERY     COLONOSCOPY     EYE SURGERY Bilateral 2004   Lasix   FRACTURE SURGERY Right 1969   Chipped bone repair on knee   JOINT REPLACEMENT     LUMBAR SPINE SURGERY     REPLACEMENT TOTAL KNEE Right    VASECTOMY  1984   Patient Active Problem List   Diagnosis Date Noted   Lumbar spinal stenosis due to adjacent segment disease after fusion procedure 02/11/2023   Degenerative spondylolisthesis 06/19/2020   Iron deficiency anemia 01/14/2020   Preop cardiovascular exam 01/14/2020   Dysphagia 01/14/2020   Cervical radiculopathy 12/08/2019   Essential hypertension 12/08/2019   Gastroesophageal reflux disease without esophagitis 12/08/2019    PCP: none on file  REFERRING PROVIDER: Julio Sicks  REFERRING DIAG: lumbar spinal stenosis  Rationale for Evaluation and Treatment: Rehabilitation  THERAPY DIAG:   Muscle weakness (generalized)  Other symptoms and signs involving the musculoskeletal system  ONSET DATE: 02/11/2023  SUBJECTIVE:                                                                                                                                                                                           SUBJECTIVE STATEMENT: Patient reports 5/10 stiffness in back this morning and mild pain in R hip/lateral upper thigh.  PERTINENT HISTORY:  Rt total knee replacement 2 x cervical surgery 2 x lumbar surgery  PAIN:  Are you having pain? 5/10 bilat QL to low back  PRECAUTIONS: None  RED FLAGS: None   WEIGHT BEARING RESTRICTIONS: No  FALLS:  Has patient fallen in last 6 months? No   OCCUPATION: retired, enjoys riding his bike, going to the gym  PLOF: Independent  PATIENT GOALS: increase leg strength, be able to stand tall  NEXT MD VISIT: 07/2023  OBJECTIVE:   PATIENT SURVEYS:  FOTO 56   SENSATION: Pt c/o some "burning" in bilat upper thighs and bilat UEs  MUSCLE LENGTH: Hamstrings: Right 50 deg; Left 50 deg  POSTURE: flexed trunk   PALPATION: TTP bilat QL  Increased  muscle spasticity bilat QL, bilat lumbar paraspinals  LUMBAR ROM:   AROM eval 10/15  Flexion 50% 75%  Extension 50% 75%  Right lateral flexion 50% 75%  Left lateral flexion 50% 75%  Right rotation 25% 50%  Left rotation 50% 50%   (Blank rows = not tested)   LOWER EXTREMITY MMT:    MMT Right eval Left eval Left 10/25  Hip flexion 3+ 3 3+  Hip extension 3 3- 3  Hip abduction 3 3 3+  Hip adduction     Hip internal rotation     Hip external rotation     Knee flexion     Knee extension     Ankle dorsiflexion     Ankle plantarflexion     Ankle inversion     Ankle eversion      (Blank rows = not tested)  FUNCTIONAL TESTS:  Rt SLS 4 sec (eval)  7 sec (10/29) Lt SLS 2 sec (eval) 10 (10/29) 5x STS: 14.96 seconds (eval), 14.52 seconds (10/8), 9.9 seconds  (10/29)    TODAY'S TREATMENT:                                                                                                                              [facility] Adult PT Treatment:                                                DATE: 06/20/2023 Therapeutic Exercise: Standing lat stretch in doorway Standing forward bend stretch with legs crossed  Halfway forward roll down with back against wall, hands on chair back Elliptical L 1.3 x 5 min fwd Seated in chair: Side bend stretch with overhead arm Thoracic extension with hands behind head + deflated coregeous ball Seated twist  Standing dead lift with hip hinge --> dowel at front of hips for hinge cueing Childs pose stretch Quadruped hip hinge rock back --> cueing flat back   [facility] Adult PT Treatment:  DATE: 06/17/23 Therapeutic Exercise: Nustep L7 x 5 min J curl no resistance x 10 - focus on spinal mobility Tandem stance on foam 2 x 30 sec bilat Narrow BOS on foam with KB swing 2 x 30 sec bilat Resisted walking backwards/forwards 15# x 10 Resisted walking sideways 15# x 7 bilat SLS trials bilat without UE support Seated figure 4 stretch 2 x 30 sec 5x STS 9.9 seconds Physioball roll outs focus on lateral stretching Pelvic tilt seated x 20 FOTO 61   [facility] Adult PT Treatment:                                                DATE: 06/13/23 Therapeutic Exercise: Nustep L7 x 5 min Supported standing 1/2 roll down at wall with 5#DB bilat Tandem stance on foam 2 x 30 sec SLS 2 x 30 sec bilat Leg press DL 086# 2 x 10 Seated dead lift 15# 2 x 10 Sit <> stand 15# KB 2 x 10 Figure 4 LTR x 10 bilat Quadruped thread the needle x 10 bilat Seated pelvic tilt x 20 Prone Small range spinal extension hands under shoulders x 10 Hip extension x 10 bilat   PATIENT EDUCATION:  Education details: Updated HEP Person educated: Patient Education method: Explanation, Demonstration, and  Handouts Education comprehension: verbalized understanding and returned demonstration  HOME EXERCISE PROGRAM: Access Code: [facility] URL: [email or web address] Date: 06/20/2023 Prepared by: Carlynn Herald  Exercises - Supine Lower Trunk Rotation  - 1 x daily - 7 x weekly - 2 sets - 10 reps - Hooklying Single Knee to Chest Stretch  - 1 x daily - 7 x weekly - 1 sets - 3 reps - 20-30 seconds hold - Sit to Stand Without Arm Support  - 1 x daily - 7 x weekly - 2 sets - 10 reps - Sidelying Open Book Thoracic Lumbar Rotation and Extension  - 1 x daily - 7 x weekly - 2 sets - 10 reps - Modified Cat Cow on Counter  - 1 x daily - 7 x weekly - 3 sets - 10 reps - Seated Pelvic Tilt  - 1 x daily - 7 x weekly - 3 sets - 10 reps - Supine Pelvic Tilt with Straight Leg Raise  - 1 x daily - 7 x weekly - 3 sets - 10 reps - Barbell Jefferson Curl  - 1 x daily - 7 x weekly - 3 sets - 10 reps - Seated Thoracic Lumbar Extension with Pectoralis Stretch  - 1 x daily - 7 x weekly - 3 sets - 10 reps - Seated Sidebending Arms Overhead  - 1 x daily - 7 x weekly - 3 sets - 10 reps - Seated Trunk Rotation  - 1 x daily - 7 x weekly - 3 sets - 10 reps - Standing Hip Hinge with Dowel  - 1 x daily - 7 x weekly - 3 sets - 10 reps - Hip Hinge Rock Back  - 1 x daily - 7 x weekly - 3 sets - 10 reps  ASSESSMENT:  CLINICAL IMPRESSION:  Session focused on progressing thoracolumbar mobility. Hip hinge mechanics and postural awareness improved with dowel placements at front of hips. HEP updated with thoracolumbar mobility exercises.  GOALS: Goals reviewed with patient? Yes  SHORT TERM GOALS: Target date: 05/27/2023  Pt will be independent with initial  HEP Baseline: Goal status: MET  2.  Pt will improve 5x STS to <= 13 seconds Baseline:  Goal status: MET   LONG TERM GOALS: Target date: 06/24/2023  Pt will be independent with advanced HEP Baseline:  Goal status: INITIAL  2.  Pt will demo improved  functional mobility with FOTO >= 65 Baseline: 61% Goal status: IN PROGRESS  3.  Pt will demo improved LE strength with 5x STS <= 11 seconds Baseline:  Goal status: MET  4.  Pt will demo improved balance with SLS >= 10 sec bilat Baseline:  Goal status: IN PROGRESS  5.  Pt will return to gym routine without increase in symptoms Baseline:  Goal status: IN PROGRESS  PLAN:  PT FREQUENCY: 2x/week  PT DURATION: 6 weeks  PLANNED INTERVENTIONS: Therapeutic exercises, Therapeutic activity, Neuromuscular re-education, Balance training, Gait training, Patient/Family education, Self Care, Joint mobilization, Aquatic Therapy, Dry Needling, Electrical stimulation, Cryotherapy, Moist heat, Taping, Ionotophoresis 4mg /ml Dexamethasone, Manual therapy, and Re-evaluation.  PLAN FOR NEXT SESSION: Re-eval next visit   Sanjuana Mae, PTA 06/20/2023, 9:46 AM

## 2023-06-24 ENCOUNTER — Ambulatory Visit: Payer: No Typology Code available for payment source | Admitting: Physical Therapy

## 2023-06-24 ENCOUNTER — Encounter: Payer: Self-pay | Admitting: Physical Therapy

## 2023-06-24 DIAGNOSIS — M6281 Muscle weakness (generalized): Secondary | ICD-10-CM | POA: Diagnosis not present

## 2023-06-24 DIAGNOSIS — R29898 Other symptoms and signs involving the musculoskeletal system: Secondary | ICD-10-CM

## 2023-06-24 NOTE — Therapy (Signed)
OUTPATIENT PHYSICAL THERAPY TREATMENT AND RECERTIFICATION  Patient Name: Jonathon Zimmerman MRN: [id number] DOB:[birth year], 67 y.o., male Today's Date: 06/24/2023  END OF SESSION:  PT End of Session - 06/24/23 1051     Visit Number 11    Number of Visits 15    Date for PT Re-Evaluation 07/22/23    Authorization Type [facility] 15 visits approved until 11/03/23    Authorization - Visit Number 11    Authorization - Number of Visits 15    Progress Note Due on Visit 15    PT Start Time 1013    PT Stop Time 1053    PT Time Calculation (min) 40 min    Activity Tolerance Patient tolerated treatment well    Behavior During Therapy WFL for tasks assessed/performed             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   Spinal stenosis    Past Surgical History:  Procedure Laterality Date   CERVICAL SPINE SURGERY     COLONOSCOPY     EYE SURGERY Bilateral 2004   Lasix   FRACTURE SURGERY Right 1969   Chipped bone repair on knee   JOINT REPLACEMENT     LUMBAR SPINE SURGERY     REPLACEMENT TOTAL KNEE Right    VASECTOMY  1984   Patient Active Problem List   Diagnosis Date Noted   Lumbar spinal stenosis due to adjacent segment disease after fusion procedure 02/11/2023   Degenerative spondylolisthesis 06/19/2020   Iron deficiency anemia 01/14/2020   Preop cardiovascular exam 01/14/2020   Dysphagia 01/14/2020   Cervical radiculopathy 12/08/2019   Essential hypertension 12/08/2019   Gastroesophageal reflux disease without esophagitis 12/08/2019    PCP: none on file  REFERRING PROVIDER: Julio Sicks  REFERRING DIAG: lumbar spinal stenosis  Rationale for Evaluation and Treatment:  Rehabilitation  THERAPY DIAG:  Muscle weakness (generalized)  Other symptoms and signs involving the musculoskeletal system  ONSET DATE: 02/11/2023  SUBJECTIVE:                                                                                                                                                                                           SUBJECTIVE STATEMENT: Patient reports he feels less stiff today, is still sore in his Rt hip and upper  thigh. Pt states he hopes to return to the gym soon  PERTINENT HISTORY:  Rt total knee replacement 2 x cervical surgery 2 x lumbar surgery  PAIN:  Are you having pain? 5/10 bilat QL to low back  PRECAUTIONS: None  RED FLAGS: None   WEIGHT BEARING RESTRICTIONS: No  FALLS:  Has patient fallen in last 6 months? No   OCCUPATION: retired, enjoys riding his bike, going to the gym  PLOF: Independent  PATIENT GOALS: increase leg strength, be able to stand tall  NEXT MD VISIT: 07/2023  OBJECTIVE:   PATIENT SURVEYS:  FOTO 56   SENSATION: Pt c/o some "burning" in bilat upper thighs and bilat UEs  MUSCLE LENGTH: Hamstrings: Right 50 deg; Left 50 deg  POSTURE: flexed trunk   PALPATION: TTP bilat QL  Increased  muscle spasticity bilat QL, bilat lumbar paraspinals  LUMBAR ROM:   AROM eval 10/15  Flexion 50% 75%  Extension 50% 75%  Right lateral flexion 50% 75%  Left lateral flexion 50% 75%  Right rotation 25% 50%  Left rotation 50% 50%   (Blank rows = not tested)   LOWER EXTREMITY MMT:    MMT Right eval Left eval Left 10/25  Hip flexion 3+ 3 3+  Hip extension 3 3- 3  Hip abduction 3 3 3+  Hip adduction     Hip internal rotation     Hip external rotation     Knee flexion     Knee extension     Ankle dorsiflexion     Ankle plantarflexion     Ankle inversion     Ankle eversion      (Blank rows = not tested)  FUNCTIONAL TESTS:  Rt SLS 4 sec (eval)  7 sec (10/29) Lt SLS 2 sec (eval) 10 (10/29) 5x STS:  14.96 seconds (eval), 14.52 seconds (10/8), 9.9 seconds (10/29)    TODAY'S TREATMENT:                                                                                                                              [facility] Adult PT Treatment:                                                DATE: 06/24/23 Therapeutic Exercise: Elliptical level 1.5 x 5 min fwd Lat stretch in doorway 3 x 20 sec bilat Seated thoracic rotation x 10 bilat Seated side bend with UE overhead reach x 10 Thoracic extension with towel roll behind back Halfway forward roll down with back against wall Pelvic tilt seated x 20 Child's pose 2 x 30 sec Childs pose with lateral stretch Quadruped hip hinge rock back J curl x 10 - no resistance  [facility] Adult PT Treatment:  DATE: 06/20/2023 Therapeutic Exercise: Standing lat stretch in doorway Standing forward bend stretch with legs crossed  Halfway forward roll down with back against wall, hands on chair back Elliptical L 1.3 x 5 min fwd Seated in chair: Side bend stretch with overhead arm Thoracic extension with hands behind head + deflated coregeous ball Seated twist  Standing dead lift with hip hinge --> dowel at front of hips for hinge cueing Childs pose stretch Quadruped hip hinge rock back --> cueing flat back   [facility] Adult PT Treatment:                                                DATE: 06/17/23 Therapeutic Exercise: Nustep L7 x 5 min J curl no resistance x 10 - focus on spinal mobility Tandem stance on foam 2 x 30 sec bilat Narrow BOS on foam with KB swing 2 x 30 sec bilat Resisted walking backwards/forwards 15# x 10 Resisted walking sideways 15# x 7 bilat SLS trials bilat without UE support Seated figure 4 stretch 2 x 30 sec 5x STS 9.9 seconds Physioball roll outs focus on lateral stretching Pelvic tilt seated x 20 FOTO 61   PATIENT EDUCATION:  Education details: Updated HEP Person educated:  Patient Education method: Explanation, Demonstration, and Handouts Education comprehension: verbalized understanding and returned demonstration  HOME EXERCISE PROGRAM: Access Code: [facility] URL: [email or web address] Date: 06/20/2023 Prepared by: Carlynn Herald  Exercises - Supine Lower Trunk Rotation  - 1 x daily - 7 x weekly - 2 sets - 10 reps - Hooklying Single Knee to Chest Stretch  - 1 x daily - 7 x weekly - 1 sets - 3 reps - 20-30 seconds hold - Sit to Stand Without Arm Support  - 1 x daily - 7 x weekly - 2 sets - 10 reps - Sidelying Open Book Thoracic Lumbar Rotation and Extension  - 1 x daily - 7 x weekly - 2 sets - 10 reps - Modified Cat Cow on Counter  - 1 x daily - 7 x weekly - 3 sets - 10 reps - Seated Pelvic Tilt  - 1 x daily - 7 x weekly - 3 sets - 10 reps - Supine Pelvic Tilt with Straight Leg Raise  - 1 x daily - 7 x weekly - 3 sets - 10 reps - Barbell Jefferson Curl  - 1 x daily - 7 x weekly - 3 sets - 10 reps - Seated Thoracic Lumbar Extension with Pectoralis Stretch  - 1 x daily - 7 x weekly - 3 sets - 10 reps - Seated Sidebending Arms Overhead  - 1 x daily - 7 x weekly - 3 sets - 10 reps - Seated Trunk Rotation  - 1 x daily - 7 x weekly - 3 sets - 10 reps - Standing Hip Hinge with Dowel  - 1 x daily - 7 x weekly - 3 sets - 10 reps - Hip Hinge Rock Back  - 1 x daily - 7 x weekly - 3 sets - 10 reps  ASSESSMENT:  CLINICAL IMPRESSION:  Pt continues to progress well with mobility exercises. He continues to c/o stiffness and pain in Rt hip/thigh. He will continue to benefit from skilled PT to work towards remaining goals  GOALS: Goals reviewed with patient? Yes  SHORT TERM GOALS: Target date: 05/27/2023  Pt will be independent  with initial HEP Baseline: Goal status: MET  2.  Pt will improve 5x STS to <= 13 seconds Baseline:  Goal status: MET   LONG TERM GOALS: Target date: 07/22/2023   Pt will be independent with advanced HEP Baseline:  Goal  status: INITIAL  2.  Pt will demo improved functional mobility with FOTO >= 65 Baseline: 61% Goal status: IN PROGRESS  3.  Pt will demo improved LE strength with 5x STS <= 11 seconds Baseline:  Goal status: MET  4.  Pt will demo improved balance with SLS >= 10 sec bilat Baseline:  Goal status: MET  5.  Pt will return to gym routine without increase in symptoms Baseline:  Goal status: IN PROGRESS  PLAN:  PT FREQUENCY: 2x/week  PT DURATION: 6 weeks  PLANNED INTERVENTIONS: Therapeutic exercises, Therapeutic activity, Neuromuscular re-education, Balance training, Gait training, Patient/Family education, Self Care, Joint mobilization, Aquatic Therapy, Dry Needling, Electrical stimulation, Cryotherapy, Moist heat, Taping, Ionotophoresis 4mg /ml Dexamethasone, Manual therapy, and Re-evaluation.  PLAN FOR NEXT SESSION: work on Psychologist, clinical for spinal mobility   Beckam Abdulaziz, PT 06/24/2023, 10:53 AM

## 2023-06-27 ENCOUNTER — Ambulatory Visit: Payer: No Typology Code available for payment source

## 2023-07-01 ENCOUNTER — Ambulatory Visit: Payer: No Typology Code available for payment source | Admitting: Physical Therapy

## 2023-07-01 ENCOUNTER — Encounter: Payer: Self-pay | Admitting: Physical Therapy

## 2023-07-01 DIAGNOSIS — R29898 Other symptoms and signs involving the musculoskeletal system: Secondary | ICD-10-CM

## 2023-07-01 DIAGNOSIS — M6281 Muscle weakness (generalized): Secondary | ICD-10-CM | POA: Diagnosis not present

## 2023-07-01 NOTE — Therapy (Signed)
OUTPATIENT PHYSICAL THERAPY TREATMENT AND RECERTIFICATION  Patient Name: Jonathon Zimmerman MRN: [id number] DOB:October 12, 1955, 67 y.o., male Today's Date: 07/01/2023  END OF SESSION:  PT End of Session - 07/01/23 1005     Visit Number 12    Number of Visits 15    Date for PT Re-Evaluation 07/22/23    Authorization Type [facility] 15 visits approved until 11/03/23    Authorization - Visit Number 12    Authorization - Number of Visits 15    Progress Note Due on Visit 15    PT Start Time 0927    PT Stop Time 1005    PT Time Calculation (min) 38 min    Activity Tolerance Patient tolerated treatment well    Behavior During Therapy WFL for tasks assessed/performed              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   Spinal stenosis    Past Surgical History:  Procedure Laterality Date   CERVICAL SPINE SURGERY     COLONOSCOPY     EYE SURGERY Bilateral 2004   Lasix   FRACTURE SURGERY Right 1969   Chipped bone repair on knee   JOINT REPLACEMENT     LUMBAR SPINE SURGERY     REPLACEMENT TOTAL KNEE Right    VASECTOMY  1984   Patient Active Problem List   Diagnosis Date Noted   Lumbar spinal stenosis due to adjacent segment disease after fusion procedure 02/11/2023   Degenerative spondylolisthesis 06/19/2020   Iron deficiency anemia 01/14/2020   Preop cardiovascular exam 01/14/2020   Dysphagia 01/14/2020   Cervical radiculopathy 12/08/2019   Essential hypertension 12/08/2019   Gastroesophageal reflux disease without esophagitis 12/08/2019    PCP: none on file  REFERRING PROVIDER: Julio Sicks  REFERRING DIAG: lumbar spinal stenosis  Rationale for Evaluation and Treatment:  Rehabilitation  THERAPY DIAG:  Muscle weakness (generalized)  Other symptoms and signs involving the musculoskeletal system  ONSET DATE: 02/11/2023  SUBJECTIVE:                                                                                                                                                                                           SUBJECTIVE STATEMENT: Pt states he has been continuing to have pain on Rt low back that goes  down hip. He started hurting Friday and continued to hurt throughout the weekend  PERTINENT HISTORY:  Rt total knee replacement 2 x cervical surgery 2 x lumbar surgery  PAIN:  Are you having pain? 5/10 bilat QL to low back  PRECAUTIONS: None  RED FLAGS: None   WEIGHT BEARING RESTRICTIONS: No  FALLS:  Has patient fallen in last 6 months? No   OCCUPATION: retired, enjoys riding his bike, going to the gym  PLOF: Independent  PATIENT GOALS: increase leg strength, be able to stand tall  NEXT MD VISIT: 07/2023  OBJECTIVE:   PATIENT SURVEYS:  FOTO 56   SENSATION: Pt c/o some "burning" in bilat upper thighs and bilat UEs  MUSCLE LENGTH: Hamstrings: Right 50 deg; Left 50 deg  POSTURE: flexed trunk   PALPATION: TTP bilat QL  Increased  muscle spasticity bilat QL, bilat lumbar paraspinals  LUMBAR ROM:   AROM eval 10/15  Flexion 50% 75%  Extension 50% 75%  Right lateral flexion 50% 75%  Left lateral flexion 50% 75%  Right rotation 25% 50%  Left rotation 50% 50%   (Blank rows = not tested)   LOWER EXTREMITY MMT:    MMT Right eval Left eval Left 10/25  Hip flexion 3+ 3 3+  Hip extension 3 3- 3  Hip abduction 3 3 3+  Hip adduction     Hip internal rotation     Hip external rotation     Knee flexion     Knee extension     Ankle dorsiflexion     Ankle plantarflexion     Ankle inversion     Ankle eversion      (Blank rows = not tested)  FUNCTIONAL TESTS:  Rt SLS 4 sec (eval)  7 sec (10/29) Lt SLS 2 sec (eval) 10  (10/29) 5x STS: 14.96 seconds (eval), 14.52 seconds (10/8), 9.9 seconds (10/29)    TODAY'S TREATMENT:                                                                                                                              [facility] Adult PT Treatment:                                                DATE: 07/01/23 Therapeutic Exercise: Ellipitcal L1.5 x 5 min Standing Lat stretch in doorway 3 x 20 sec bilat J curl x 10 no resistance Standing thoracic rotation x 10 bilat Windmill stretch x 10 bilat Seated pelvic tilt x 20 Seated thoracic extension Quadruped hip hinge rock back Seated figure 4 stretch with side bend 3 x 20 sec bilat   [facility] Adult PT Treatment:  DATE: 06/24/23 Therapeutic Exercise: Elliptical level 1.5 x 5 min fwd Lat stretch in doorway 3 x 20 sec bilat Seated thoracic rotation x 10 bilat Seated side bend with UE overhead reach x 10 Thoracic extension with towel roll behind back Halfway forward roll down with back against wall Pelvic tilt seated x 20 Child's pose 2 x 30 sec Childs pose with lateral stretch Quadruped hip hinge rock back J curl x 10 - no resistance  [facility] Adult PT Treatment:                                                DATE: 06/20/2023 Therapeutic Exercise: Standing lat stretch in doorway Standing forward bend stretch with legs crossed  Halfway forward roll down with back against wall, hands on chair back Elliptical L 1.3 x 5 min fwd Seated in chair: Side bend stretch with overhead arm Thoracic extension with hands behind head + deflated coregeous ball Seated twist  Standing dead lift with hip hinge --> dowel at front of hips for hinge cueing Childs pose stretch Quadruped hip hinge rock back --> cueing flat back   PATIENT EDUCATION:  Education details: Updated HEP Person educated: Patient Education method: Programmer, multimedia, Facilities manager, and Handouts Education comprehension: verbalized understanding  and returned demonstration  HOME EXERCISE PROGRAM: Access Code: [facility] URL: [email or web address] Date: 06/20/2023 Prepared by: Carlynn Herald  Exercises - Supine Lower Trunk Rotation  - 1 x daily - 7 x weekly - 2 sets - 10 reps - Hooklying Single Knee to Chest Stretch  - 1 x daily - 7 x weekly - 1 sets - 3 reps - 20-30 seconds hold - Sit to Stand Without Arm Support  - 1 x daily - 7 x weekly - 2 sets - 10 reps - Sidelying Open Book Thoracic Lumbar Rotation and Extension  - 1 x daily - 7 x weekly - 2 sets - 10 reps - Modified Cat Cow on Counter  - 1 x daily - 7 x weekly - 3 sets - 10 reps - Seated Pelvic Tilt  - 1 x daily - 7 x weekly - 3 sets - 10 reps - Supine Pelvic Tilt with Straight Leg Raise  - 1 x daily - 7 x weekly - 3 sets - 10 reps - Barbell Jefferson Curl  - 1 x daily - 7 x weekly - 3 sets - 10 reps - Seated Thoracic Lumbar Extension with Pectoralis Stretch  - 1 x daily - 7 x weekly - 3 sets - 10 reps - Seated Sidebending Arms Overhead  - 1 x daily - 7 x weekly - 3 sets - 10 reps - Seated Trunk Rotation  - 1 x daily - 7 x weekly - 3 sets - 10 reps - Standing Hip Hinge with Dowel  - 1 x daily - 7 x weekly - 3 sets - 10 reps - Hip Hinge Rock Back  - 1 x daily - 7 x weekly - 3 sets - 10 reps  ASSESSMENT:  CLINICAL IMPRESSION:  Pt continues to be limited by increased pain and stiffness. He also had a migraine today which made him require increased rest breaks during session  GOALS: Goals reviewed with patient? Yes  SHORT TERM GOALS: Target date: 05/27/2023  Pt will be independent with initial HEP Baseline: Goal status: MET  2.  Pt will improve 5x STS  to <= 13 seconds Baseline:  Goal status: MET   LONG TERM GOALS: Target date: 07/22/2023   Pt will be independent with advanced HEP Baseline:  Goal status: INITIAL  2.  Pt will demo improved functional mobility with FOTO >= 65 Baseline: 61% Goal status: IN PROGRESS  3.  Pt will demo improved LE  strength with 5x STS <= 11 seconds Baseline:  Goal status: MET  4.  Pt will demo improved balance with SLS >= 10 sec bilat Baseline:  Goal status: MET  5.  Pt will return to gym routine without increase in symptoms Baseline:  Goal status: IN PROGRESS  PLAN:  PT FREQUENCY: 2x/week  PT DURATION: 6 weeks  PLANNED INTERVENTIONS: Therapeutic exercises, Therapeutic activity, Neuromuscular re-education, Balance training, Gait training, Patient/Family education, Self Care, Joint mobilization, Aquatic Therapy, Dry Needling, Electrical stimulation, Cryotherapy, Moist heat, Taping, Ionotophoresis 4mg /ml Dexamethasone, Manual therapy, and Re-evaluation.  PLAN FOR NEXT SESSION: work on Psychologist, clinical for spinal mobility   Sheriff Rodenberg, PT 07/01/2023, 10:06 AM

## 2023-07-04 ENCOUNTER — Ambulatory Visit: Payer: No Typology Code available for payment source

## 2023-07-11 ENCOUNTER — Ambulatory Visit: Payer: No Typology Code available for payment source | Admitting: Physical Therapy

## 2024-08-24 ENCOUNTER — Other Ambulatory Visit: Payer: Self-pay | Admitting: Neurosurgery

## 2024-08-24 DIAGNOSIS — M4316 Spondylolisthesis, lumbar region: Secondary | ICD-10-CM

## 2024-09-01 ENCOUNTER — Ambulatory Visit

## 2024-09-01 DIAGNOSIS — Z9889 Other specified postprocedural states: Secondary | ICD-10-CM | POA: Diagnosis not present

## 2024-09-01 DIAGNOSIS — M4316 Spondylolisthesis, lumbar region: Secondary | ICD-10-CM | POA: Diagnosis not present
